# Patient Record
Sex: Female | Born: 1980 | Race: Black or African American | Hispanic: No | Marital: Married | Smoking: Never smoker
Health system: Southern US, Community
[De-identification: ages and names within clinical notes are randomized; demographics above are authoritative.]

## PROBLEM LIST (undated history)

## (undated) DIAGNOSIS — R519 Headache, unspecified: Secondary | ICD-10-CM

## (undated) DIAGNOSIS — J45909 Unspecified asthma, uncomplicated: Secondary | ICD-10-CM

## (undated) DIAGNOSIS — I10 Essential (primary) hypertension: Secondary | ICD-10-CM

## (undated) DIAGNOSIS — T7840XA Allergy, unspecified, initial encounter: Secondary | ICD-10-CM

## (undated) HISTORY — PX: LAPAROSCOPIC GASTRIC BANDING: SHX1100

## (undated) HISTORY — DX: Headache, unspecified: R51.9

## (undated) HISTORY — PX: GASTRECTOMY: SHX58

## (undated) HISTORY — PX: GASTRIC BYPASS: SHX52

## (undated) HISTORY — DX: Unspecified asthma, uncomplicated: J45.909

## (undated) HISTORY — DX: Essential (primary) hypertension: I10

## (undated) HISTORY — DX: Allergy, unspecified, initial encounter: T78.40XA

---

## 2020-03-15 DIAGNOSIS — J4530 Mild persistent asthma, uncomplicated: Secondary | ICD-10-CM | POA: Insufficient documentation

## 2020-03-15 DIAGNOSIS — Z9884 Bariatric surgery status: Secondary | ICD-10-CM | POA: Insufficient documentation

## 2020-03-15 DIAGNOSIS — F419 Anxiety disorder, unspecified: Secondary | ICD-10-CM | POA: Insufficient documentation

## 2020-03-15 DIAGNOSIS — K219 Gastro-esophageal reflux disease without esophagitis: Secondary | ICD-10-CM | POA: Insufficient documentation

## 2020-05-19 ENCOUNTER — Emergency Department
Admission: EM | Admit: 2020-05-19 | Discharge: 2020-05-19 | Disposition: A | Discharge status: Home / Self Care | Payer: BC Managed Care – PPO | Attending: Emergency Medicine | Admitting: Emergency Medicine

## 2020-05-19 ENCOUNTER — Emergency Department: Payer: BC Managed Care – PPO

## 2020-05-19 ENCOUNTER — Encounter: Payer: Self-pay | Admitting: Radiology

## 2020-05-19 ENCOUNTER — Other Ambulatory Visit: Payer: Self-pay

## 2020-05-19 DIAGNOSIS — R1012 Left upper quadrant pain: Secondary | ICD-10-CM | POA: Insufficient documentation

## 2020-05-19 DIAGNOSIS — R030 Elevated blood-pressure reading, without diagnosis of hypertension: Secondary | ICD-10-CM | POA: Diagnosis not present

## 2020-05-19 DIAGNOSIS — R109 Unspecified abdominal pain: Secondary | ICD-10-CM

## 2020-05-19 DIAGNOSIS — R1110 Vomiting, unspecified: Secondary | ICD-10-CM | POA: Insufficient documentation

## 2020-05-19 LAB — CBC
HCT: 34.9 % — ABNORMAL LOW (ref 36.0–46.0)
Hemoglobin: 12 g/dL (ref 12.0–15.0)
MCH: 31.5 pg (ref 26.0–34.0)
MCHC: 34.4 g/dL (ref 30.0–36.0)
MCV: 91.6 fL (ref 80.0–100.0)
Platelets: 244 10*3/uL (ref 150–400)
RBC: 3.81 MIL/uL — ABNORMAL LOW (ref 3.87–5.11)
RDW: 12.9 % (ref 11.5–15.5)
WBC: 3.9 10*3/uL — ABNORMAL LOW (ref 4.0–10.5)
nRBC: 0 % (ref 0.0–0.2)

## 2020-05-19 LAB — URINALYSIS, COMPLETE (UACMP) WITH MICROSCOPIC
Bacteria, UA: NONE SEEN
Bilirubin Urine: NEGATIVE
Glucose, UA: NEGATIVE mg/dL
Ketones, ur: 5 mg/dL — AB
Leukocytes,Ua: NEGATIVE
Nitrite: NEGATIVE
Protein, ur: NEGATIVE mg/dL
Specific Gravity, Urine: 1.014 (ref 1.005–1.030)
pH: 6 (ref 5.0–8.0)

## 2020-05-19 LAB — COMPREHENSIVE METABOLIC PANEL
ALT: 35 U/L (ref 0–44)
AST: 74 U/L — ABNORMAL HIGH (ref 15–41)
Albumin: 4.2 g/dL (ref 3.5–5.0)
Alkaline Phosphatase: 65 U/L (ref 38–126)
Anion gap: 10 (ref 5–15)
BUN: 13 mg/dL (ref 6–20)
CO2: 25 mmol/L (ref 22–32)
Calcium: 8.8 mg/dL — ABNORMAL LOW (ref 8.9–10.3)
Chloride: 101 mmol/L (ref 98–111)
Creatinine, Ser: 0.63 mg/dL (ref 0.44–1.00)
GFR, Estimated: >60 mL/min (ref >60)
Glucose, Bld: 85 mg/dL (ref 70–99)
Potassium: 3.9 mmol/L (ref 3.5–5.1)
Sodium: 136 mmol/L (ref 135–145)
Total Bilirubin: 0.6 mg/dL (ref 0.3–1.2)
Total Protein: 7.9 g/dL (ref 6.5–8.1)

## 2020-05-19 LAB — POC URINE PREG, ED: Preg Test, Ur: NEGATIVE

## 2020-05-19 LAB — LIPASE, BLOOD: Lipase: 30 U/L (ref 11–51)

## 2020-05-19 MED ORDER — IOHEXOL 300 MG/ML  SOLN
100.0000 mL | Freq: Once | INTRAMUSCULAR | Status: AC | PRN
  Administered 2020-05-19: 100 mL via INTRAVENOUS
  Filled 2020-05-19: qty 100

## 2020-05-19 MED ORDER — IOHEXOL 9 MG/ML PO SOLN
500.0000 mL | ORAL | Status: AC
  Administered 2020-05-19 (×2): 500 mL via ORAL
  Filled 2020-05-19 (×2): qty 500

## 2020-05-19 MED ORDER — ALUM & MAG HYDROXIDE-SIMETH 200-200-20 MG/5ML PO SUSP
30.0000 mL | Freq: Once | ORAL | Status: AC
  Administered 2020-05-19: 30 mL via ORAL
  Filled 2020-05-19: qty 30

## 2020-05-19 NOTE — ED Notes (Signed)
Pt states intense stomach pain on L side started this AM at work while having a BM. Pt states the pain was so bad she vomited after her BM.   Pt denies any urinary symptoms or bowel movement abnormalities at this time.   Pt states she has hx of ulcer, and gastric bypass surgery.

## 2020-05-19 NOTE — ED Notes (Signed)
PT verbalized understanding of d/c instructions at this time. Pt denies further questions at this time. Pt ambulatory to lobby, NAD noted, steady gait noted.

## 2020-05-19 NOTE — ED Triage Notes (Signed)
PT to ED for chief complaint of mid left abdominal pain while having a BM this morning, and vomiting x1.  Hx weight loss surgery and ulcers

## 2020-05-19 NOTE — ED Provider Notes (Signed)
[facility] Emergency Department Provider Note  ____________________________________________   Event Date/Time   First MD Initiated Contact with Patient 05/19/20 1334     (approximate)  I have reviewed the triage vital signs and the nursing notes.   HISTORY  Chief Complaint Abdominal Pain   HPI Kara Tate is a 40 y.o. female with a past medical history of asthma, GERD, and gastric bypass surgery performed in 2020 in [place] who presents to emergency room for an episode of left-sided abdominal discomfort that occurred this morning after she had a bowel movement and was associated with some dry heaving and small bit of nonbloody emesis.  No prior similar episodes.  No clear alleviating or aggravating or precipitating events.  Patient denies any earache, sore throat, chest pain, cough, shortness of breath, blood in her stool, urinary symptoms, back pain, EXTR rash or extremity pain.  She takes omeprazole as recommended by her PCP and does not use any significant NSAIDs.  She does drink 2 to 3 glasses of wine per night.  No tobacco or drugs are not prescribed to her.          History reviewed. No pertinent past medical history.  There are no problems to display for this patient.     Prior to Admission medications   Not on File    Allergies Patient has no allergy information on record.  No family history on file.  Social History    Review of Systems  Review of Systems  Constitutional: Negative for chills and fever.  HENT: Negative for sore throat.   Eyes: Negative for pain.  Respiratory: Negative for cough and stridor.   Cardiovascular: Negative for chest pain.  Gastrointestinal: Positive for abdominal pain, nausea and vomiting.  Genitourinary: Negative for dysuria.  Musculoskeletal: Negative for myalgias.  Skin: Negative for rash.  Neurological: Negative for seizures, loss of consciousness and headaches.   Psychiatric/Behavioral: Negative for suicidal ideas.  All other systems reviewed and are negative.     ____________________________________________   PHYSICAL EXAM:  VITAL SIGNS: ED Triage Vitals  Enc Vitals Group     BP 05/19/20 1239 (!) 153/104     Pulse Rate 05/19/20 1237 67     Resp 05/19/20 1237 18     Temp 05/19/20 1237 98.3 F (36.8 C)     Temp Source 05/19/20 1237 Oral     SpO2 05/19/20 1237 100 %     Weight 05/19/20 1237 170 lb (77.1 kg)     Height 05/19/20 1237 5\' 2"  (1.575 m)     Head Circumference --      Peak Flow --      Pain Score 05/19/20 1237 10     Pain Loc --      Pain Edu? --      Excl. in GC? --    Vitals:   05/19/20 1237 05/19/20 1239  BP:  (!) 153/104  Pulse: 67   Resp: 18   Temp: 98.3 F (36.8 C)   SpO2: 100%    Physical Exam Vitals and nursing note reviewed.  Constitutional:      General: She is not in acute distress.    Appearance: She is well-developed and well-nourished.  HENT:     Head: Normocephalic and atraumatic.     Right Ear: External ear normal.     Left Ear: External ear normal.     Nose: Nose normal.  Eyes:     Conjunctiva/sclera: Conjunctivae normal.  Cardiovascular:  Rate and Rhythm: Normal rate and regular rhythm.     Heart sounds: No murmur heard.   Pulmonary:     Effort: Pulmonary effort is normal. No respiratory distress.     Breath sounds: Normal breath sounds.  Abdominal:     Palpations: Abdomen is soft.     Tenderness: There is abdominal tenderness in the left upper quadrant. There is no right CVA tenderness or left CVA tenderness.  Musculoskeletal:        General: No edema.     Cervical back: Neck supple.  Skin:    General: Skin is warm and dry.     Capillary Refill: Capillary refill takes less than 2 seconds.  Neurological:     Mental Status: She is alert and oriented to person, place, and time.  Psychiatric:        Mood and Affect: Mood and affect and mood normal.       ____________________________________________   LABS (all labs ordered are listed, but only abnormal results are displayed)  Labs Reviewed  COMPREHENSIVE METABOLIC PANEL - Abnormal; Notable for the following components:      Result Value   Calcium 8.8 (*)    AST 74 (*)    All other components within normal limits  CBC - Abnormal; Notable for the following components:   WBC 3.9 (*)    RBC 3.81 (*)    HCT 34.9 (*)    All other components within normal limits  URINALYSIS, COMPLETE (UACMP) WITH MICROSCOPIC - Abnormal; Notable for the following components:   Color, Urine YELLOW (*)    APPearance CLEAR (*)    Hgb urine dipstick MODERATE (*)    Ketones, ur 5 (*)    All other components within normal limits  LIPASE, BLOOD  POC URINE PREG, ED   ____________________________________________  EKG  Sinus bradycardia with ventricular rate of 57, normal axis, unremarkable levels, evidence of acute ischemia or significant underlying arrhythmia. ____________________________________________  RADIOLOGY  ED MD interpretation: CT without evidence of diverticulitis, SBO, pyelonephritis, splenic infarct, perforated viscus, appendicitis, or any other acute intra-abdominal or pelvic pathology that would explain patient's symptoms.  Official radiology report(s): CT ABDOMEN PELVIS W CONTRAST  Result Date: 05/19/2020 CLINICAL DATA:  Abdominal pain.  Left-sided.  Vomiting. EXAM: CT ABDOMEN AND PELVIS WITH CONTRAST TECHNIQUE: Multidetector CT imaging of the abdomen and pelvis was performed using the standard protocol following bolus administration of intravenous contrast. CONTRAST:  OMNIPAQUE IOHEXOL 300 MG/ML  SOLN COMPARISON:  None. FINDINGS: Lower chest: Unremarkable. Hepatobiliary: No suspicious focal abnormality within the liver parenchyma. There is no evidence for gallstones, gallbladder wall thickening, or pericholecystic fluid. No intrahepatic or extrahepatic biliary dilation. Pancreas: No  focal mass lesion. No dilatation of the main duct. No intraparenchymal cyst. No peripancreatic edema. Spleen: No splenomegaly. No focal mass lesion. Adrenals/Urinary Tract: No adrenal nodule or mass. Kidneys unremarkable. No evidence for hydroureter. The urinary bladder appears normal for the degree of distention. Stomach/Bowel: Status post gastric bypass. No small bowel wall thickening. No small bowel dilatation. The terminal ileum is normal. The appendix is normal. No gross colonic mass. No colonic wall thickening. Vascular/Lymphatic: No abdominal aortic aneurysm. No abdominal aortic atherosclerotic calcification. There is no gastrohepatic or hepatoduodenal ligament lymphadenopathy. No retroperitoneal or mesenteric lymphadenopathy. No pelvic sidewall lymphadenopathy. Reproductive: IUD visualized in the uterus. There is no adnexal mass. Other: No intraperitoneal free fluid. Musculoskeletal: No worrisome lytic or sclerotic osseous abnormality. IMPRESSION: 1. No acute findings in the abdomen or pelvis. Specifically,  no findings to explain the patient's history of left-sided abdominal pain with vomiting. 2. Status post gastric bypass. No evidence for small bowel obstruction. Electronically Signed   By: Kennith Center M.D.   On: 05/19/2020 15:35    ____________________________________________   PROCEDURES  Procedure(s) performed (including Critical Care):  Procedures   ____________________________________________   INITIAL IMPRESSION / ASSESSMENT AND PLAN / ED COURSE        Patient presents with above history exam for assessment of subacute onset of left upper quadrant abdominal pain that occurred after a bowel movement this morning and was associate with some nausea and small amount of nonbloody emesis.  This in the setting of gastric bypass surgery done in [place] in 2020.  Patient is hypertensive otherwise stable vital signs on arrival.    Differential includes but is not limited to gastritis  versus PUD versus complication from bypass surgery, diverticulitis, SBO, pancreatitis, cholecystitis, pyelonephritis, kidney stone and possible atypical presentation of ACS.  Low suspicion for ACS given patient denies any chest pain and has a reassuring EKG.  CT has no evidence of diverticulitis, SBO, pancreatitis, cholecystitis, pyelonephritis or kidney stone.  Lipase of 30 is also not consistent with pancreatitis.  CMP unremarkable for any significant electrolyte or metabolic derangements although AST is just above normal limits.  CBC is very slight leukopenia at 3.9 otherwise unremarkable.  Urine pregnancy test is negative and urine has little bit of blood and ketones but otherwise unremarkable for evidence of infection.   Given stable vital signs otherwise reassuring exam and work-up I believe patient is safe for discharge with plan for continued outpatient evaluation and management.  Patient did request outpatient referral to GI and she was given GI clinic information.  She is already on acid suppression medications we will defer any additional medications at this time.  Advised patient is to decrease her alcohol intake to less than 2 alcoholic beverages per night.  Patient discharged stable condition.  Strict return precautions advised and discussed.    ____________________________________________   FINAL CLINICAL IMPRESSION(S) / ED DIAGNOSES  Final diagnoses:  Abdominal pain, unspecified abdominal location    Medications  iohexol (OMNIPAQUE) 9 MG/ML oral solution 500 mL (500 mLs Oral Contrast Given 05/19/20 1350)  iohexol (OMNIPAQUE) 300 MG/ML solution 100 mL (100 mLs Intravenous Contrast Given 05/19/20 1522)  alum & mag hydroxide-simeth (MAALOX/MYLANTA) 200-200-20 MG/5ML suspension 30 mL (30 mLs Oral Given 05/19/20 1504)     ED Discharge Orders    None       Note:  This document was prepared using Dragon voice recognition software and may include unintentional dictation errors.    Gilles Chiquito, MD 05/19/20 1550

## 2020-09-14 ENCOUNTER — Ambulatory Visit: Payer: BC Managed Care – PPO | Admitting: Nurse Practitioner

## 2020-09-14 ENCOUNTER — Other Ambulatory Visit: Payer: Self-pay

## 2020-09-14 VITALS — BP 130/94 | HR 66 | Temp 99.2°F | Resp 17

## 2020-09-14 DIAGNOSIS — S40861A Insect bite (nonvenomous) of right upper arm, initial encounter: Secondary | ICD-10-CM

## 2020-09-14 DIAGNOSIS — W57XXXA Bitten or stung by nonvenomous insect and other nonvenomous arthropods, initial encounter: Secondary | ICD-10-CM

## 2020-09-14 NOTE — Progress Notes (Signed)
   Subjective:    Patient ID: Kara Tate, female    DOB: [birth year], 40 y.o.   MRN: [id number]  HPI  40 year old female presenting to ESW immediately after being stung by a bee in the parking lot outside of the building. She was stung once in the right forearm. Has been stung in the past without allergic response.   Today's Vitals   09/14/20 1359  BP: (!) 130/94  Pulse: 66  Resp: 17  Temp: 99.2 F (37.3 C)  TempSrc: Tympanic  SpO2: 99%   There is no height or weight on file to calculate BMI.  Review of Systems  Constitutional: Negative.   HENT: Negative.   Respiratory: Negative.   Skin: Positive for wound.       Objective:   Physical Exam HENT:     Head: Normocephalic.  Cardiovascular:     Rate and Rhythm: Normal rate and regular rhythm.     Heart sounds: Normal heart sounds.  Pulmonary:     Effort: Pulmonary effort is normal.     Breath sounds: Normal breath sounds.  Skin:    Findings: Signs of injury present.          Comments: Bite mark to right forearm as highlighted in image. Surrounding skin WNL. No retained foreign object visualized. Soft and tender to touch. No erythema skin temperature uniform.   Neurological:     Mental Status: She is alert.           Assessment & Plan:  Administered tylenol 500mg  and Claritin 10mg  in clinic.  Patient instructed to take benadryl if any onset of rash/swelling With any more concerning symptoms RTC or seek medical attention if after hours

## 2021-03-01 ENCOUNTER — Ambulatory Visit: Payer: BC Managed Care – PPO

## 2021-03-01 ENCOUNTER — Other Ambulatory Visit: Payer: Self-pay

## 2021-03-01 DIAGNOSIS — Z23 Encounter for immunization: Secondary | ICD-10-CM

## 2021-09-29 DIAGNOSIS — K580 Irritable bowel syndrome with diarrhea: Secondary | ICD-10-CM | POA: Insufficient documentation

## 2021-10-18 DIAGNOSIS — F411 Generalized anxiety disorder: Secondary | ICD-10-CM | POA: Diagnosis not present

## 2021-10-19 DIAGNOSIS — E669 Obesity, unspecified: Secondary | ICD-10-CM | POA: Diagnosis not present

## 2021-10-19 DIAGNOSIS — F419 Anxiety disorder, unspecified: Secondary | ICD-10-CM | POA: Diagnosis not present

## 2021-10-19 DIAGNOSIS — Z9884 Bariatric surgery status: Secondary | ICD-10-CM | POA: Diagnosis not present

## 2021-10-19 DIAGNOSIS — Z713 Dietary counseling and surveillance: Secondary | ICD-10-CM | POA: Diagnosis not present

## 2021-11-03 DIAGNOSIS — F411 Generalized anxiety disorder: Secondary | ICD-10-CM | POA: Diagnosis not present

## 2021-11-16 DIAGNOSIS — M25512 Pain in left shoulder: Secondary | ICD-10-CM | POA: Diagnosis not present

## 2021-11-17 DIAGNOSIS — F411 Generalized anxiety disorder: Secondary | ICD-10-CM | POA: Diagnosis not present

## 2021-12-01 DIAGNOSIS — M25512 Pain in left shoulder: Secondary | ICD-10-CM | POA: Diagnosis not present

## 2021-12-14 DIAGNOSIS — M25512 Pain in left shoulder: Secondary | ICD-10-CM | POA: Diagnosis not present

## 2021-12-20 DIAGNOSIS — M25512 Pain in left shoulder: Secondary | ICD-10-CM | POA: Diagnosis not present

## 2021-12-21 DIAGNOSIS — F411 Generalized anxiety disorder: Secondary | ICD-10-CM | POA: Diagnosis not present

## 2022-01-03 DIAGNOSIS — R29898 Other symptoms and signs involving the musculoskeletal system: Secondary | ICD-10-CM | POA: Diagnosis not present

## 2022-01-17 DIAGNOSIS — Z8711 Personal history of peptic ulcer disease: Secondary | ICD-10-CM | POA: Diagnosis not present

## 2022-01-17 DIAGNOSIS — F419 Anxiety disorder, unspecified: Secondary | ICD-10-CM | POA: Diagnosis not present

## 2022-01-17 DIAGNOSIS — Z131 Encounter for screening for diabetes mellitus: Secondary | ICD-10-CM | POA: Diagnosis not present

## 2022-01-17 DIAGNOSIS — Z1331 Encounter for screening for depression: Secondary | ICD-10-CM | POA: Diagnosis not present

## 2022-01-17 DIAGNOSIS — Z9884 Bariatric surgery status: Secondary | ICD-10-CM | POA: Diagnosis not present

## 2022-01-17 DIAGNOSIS — Z0000 Encounter for general adult medical examination without abnormal findings: Secondary | ICD-10-CM | POA: Diagnosis not present

## 2022-01-17 DIAGNOSIS — F411 Generalized anxiety disorder: Secondary | ICD-10-CM | POA: Diagnosis not present

## 2022-01-17 DIAGNOSIS — K219 Gastro-esophageal reflux disease without esophagitis: Secondary | ICD-10-CM | POA: Diagnosis not present

## 2022-01-17 DIAGNOSIS — Z13220 Encounter for screening for lipoid disorders: Secondary | ICD-10-CM | POA: Diagnosis not present

## 2022-01-18 DIAGNOSIS — F411 Generalized anxiety disorder: Secondary | ICD-10-CM | POA: Diagnosis not present

## 2022-02-07 DIAGNOSIS — F411 Generalized anxiety disorder: Secondary | ICD-10-CM | POA: Diagnosis not present

## 2022-02-12 IMAGING — CT CT ABD-PELV W/ CM
2 of 4 series · 17 of 46 positions shown, 19 images · IV contrast (omnipaque)
Comparison: None.

CLINICAL DATA: Abdominal pain.  Left-sided.  Vomiting.

EXAM:
CT ABDOMEN AND PELVIS WITH CONTRAST
TECHNIQUE: Multidetector CT imaging of the abdomen and pelvis was performed
using the standard protocol following bolus administration of
intravenous contrast.
CONTRAST:  100mL OMNIPAQUE IOHEXOL 300 MG/ML  SOLN

[Series 2: routine abd/pel with · axial · 0.82mm/px · z∈[-861,-491]mm · 14 of 82 slices shown, 16 images]
[im 4/82  soft-tissue]
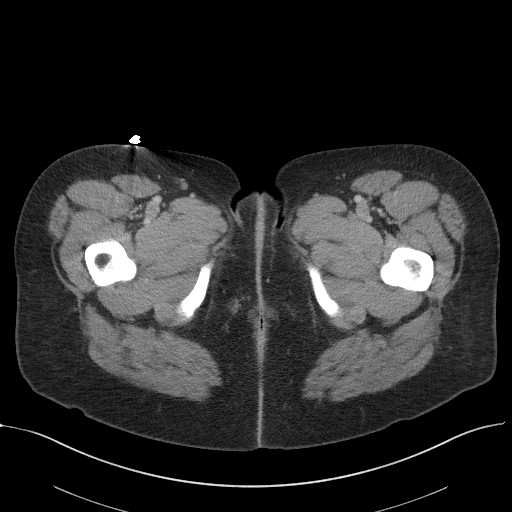
[im 4/82  bone]
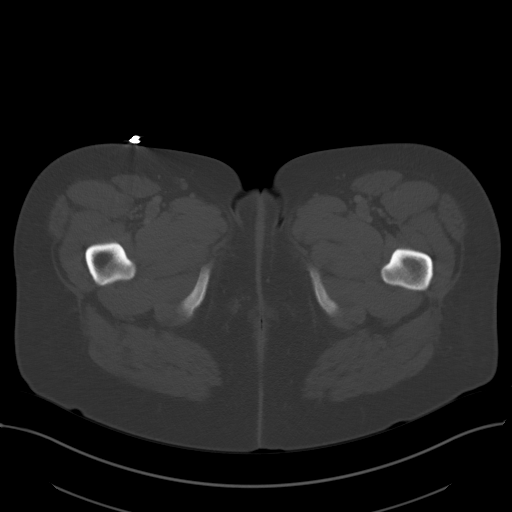
[im 11/82  soft-tissue]
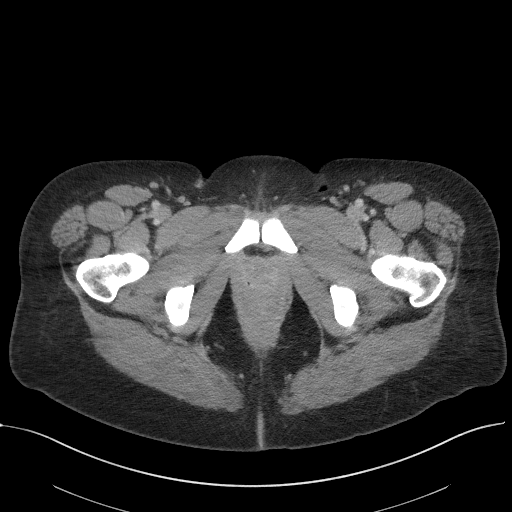
[im 17/82  soft-tissue]
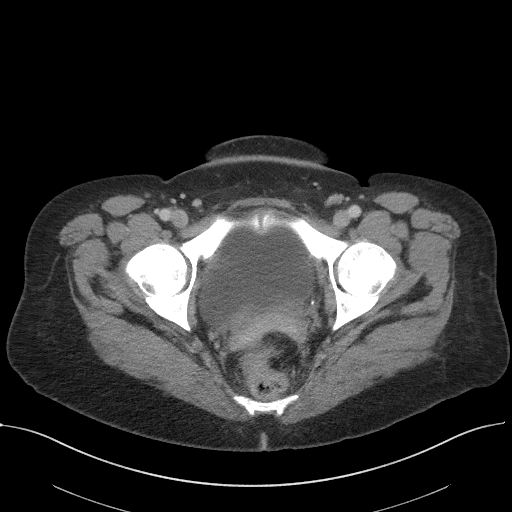
[im 21/82  soft-tissue]
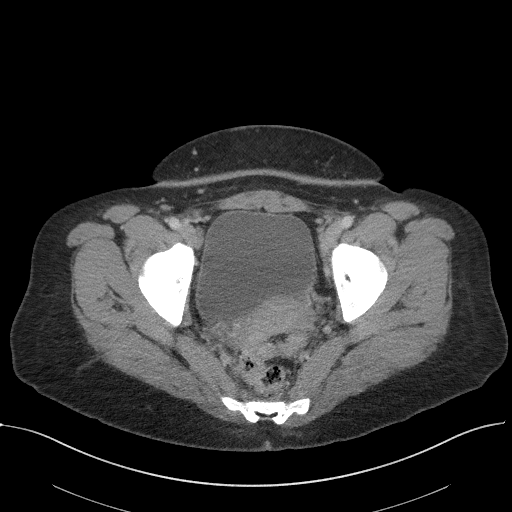
[im 28/82  soft-tissue]
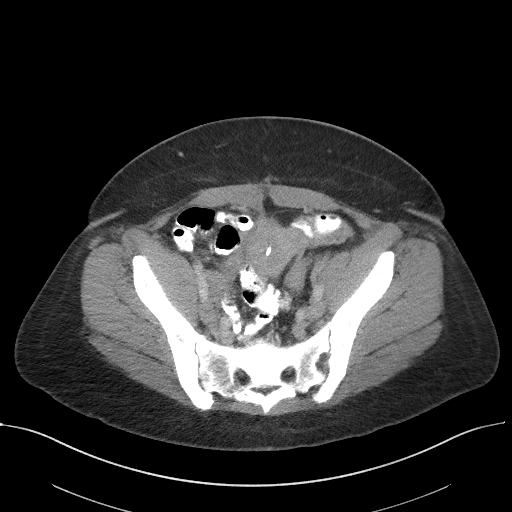
[im 34/82  soft-tissue]
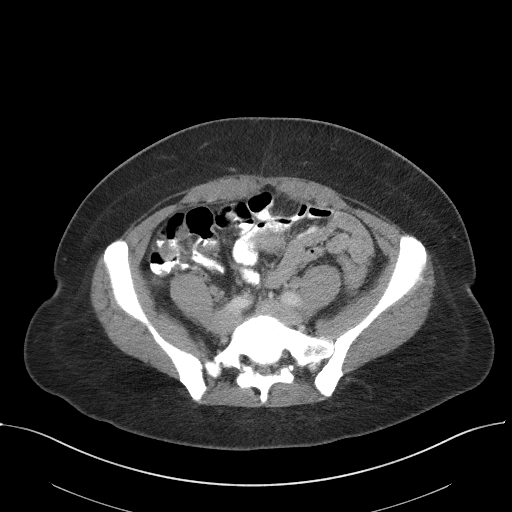
[im 38/82  soft-tissue]
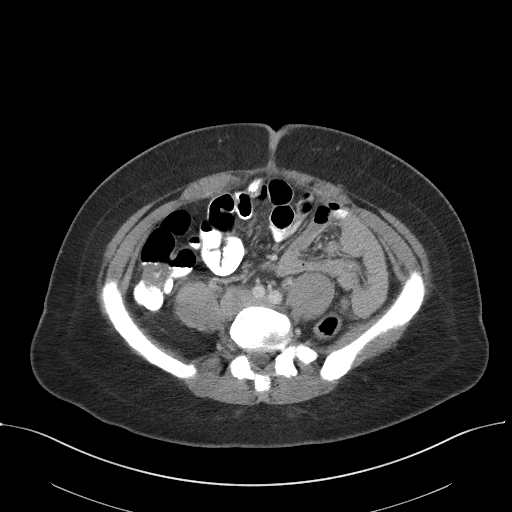
[im 44/82  soft-tissue]
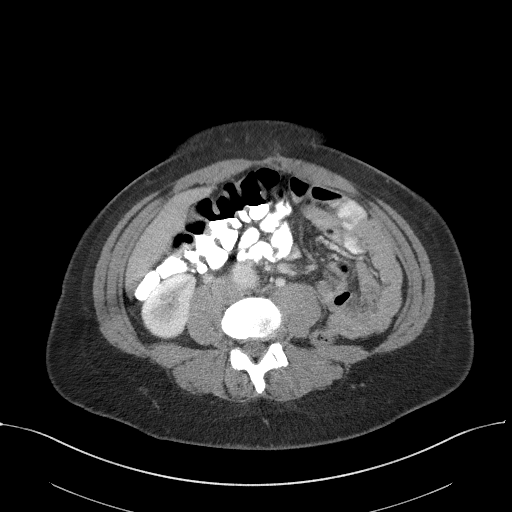
[im 48/82  soft-tissue]
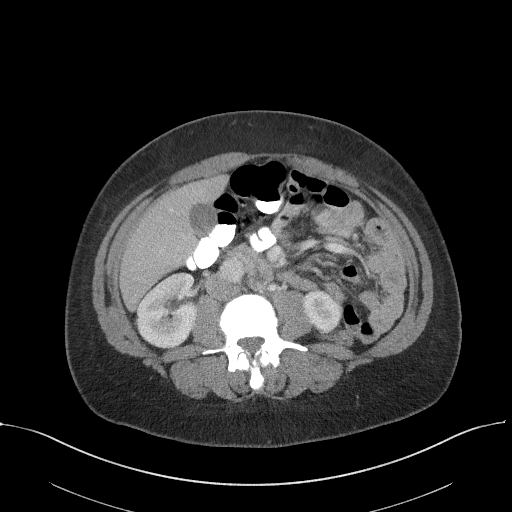
[im 48/82  bone]
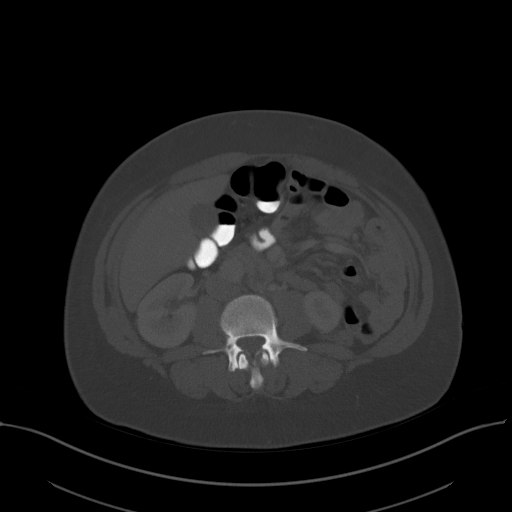
[im 55/82  soft-tissue]
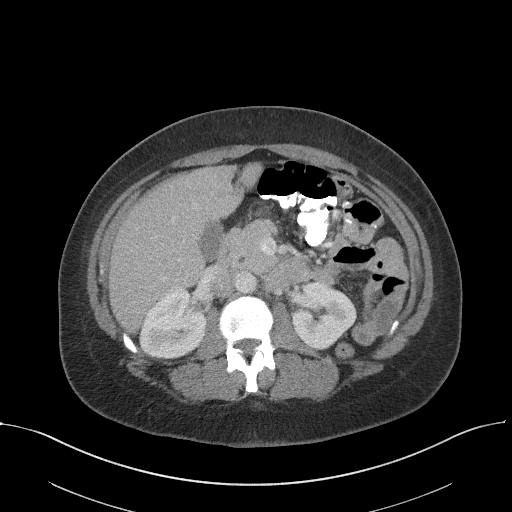
[im 61/82  soft-tissue]
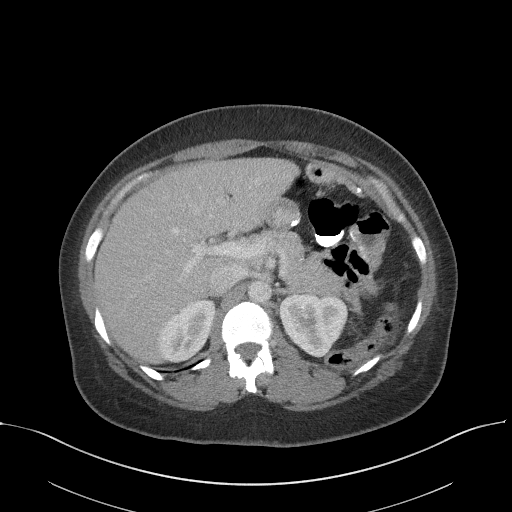
[im 65/82  soft-tissue]
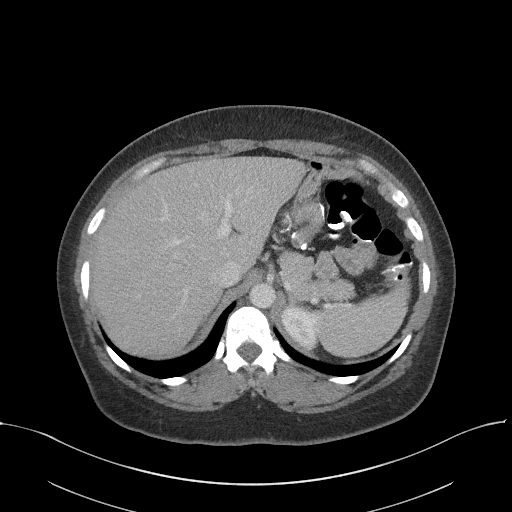
[im 71/82  soft-tissue]
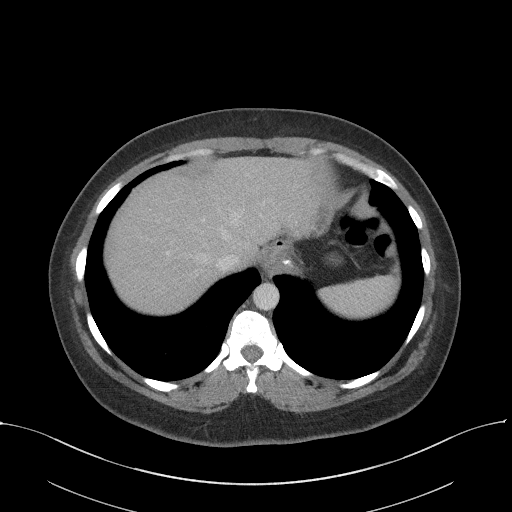
[im 78/82  soft-tissue]
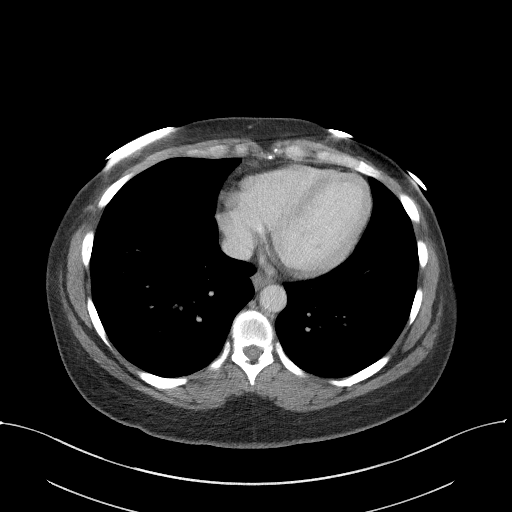

[Series 5: coronal st · coronal · 0.80mm/px · 3 of 88 slices shown]
[im 30/88  soft-tissue]
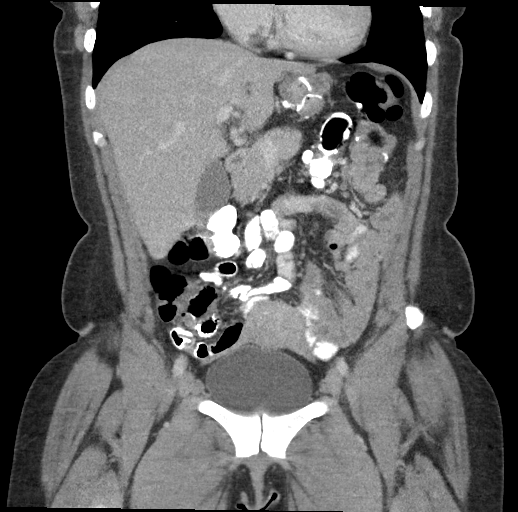
[im 39/88  soft-tissue]
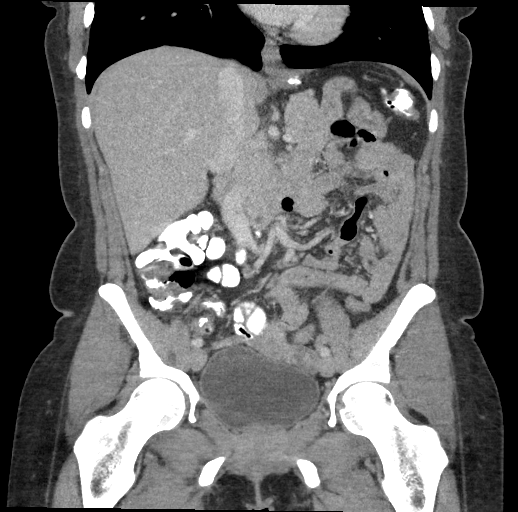
[im 49/88  soft-tissue]
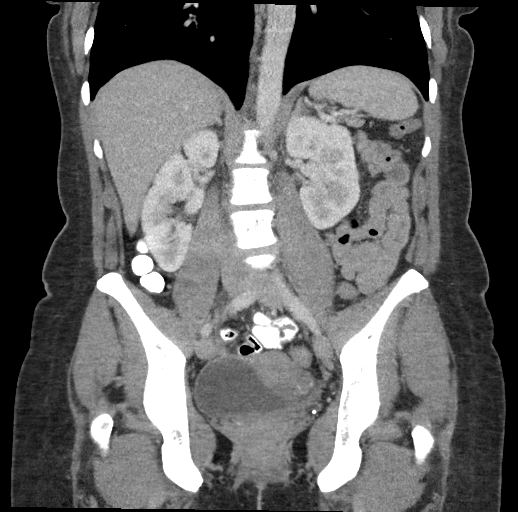

[17 of 46 positions shown; findings below may reference images not displayed]

FINDINGS: Lower chest: Unremarkable.

Hepatobiliary: No suspicious focal abnormality within the liver
parenchyma. There is no evidence for gallstones, gallbladder wall
thickening, or pericholecystic fluid. No intrahepatic or
extrahepatic biliary dilation.

Pancreas: No focal mass lesion. No dilatation of the main duct. No
intraparenchymal cyst. No peripancreatic edema.

Spleen: No splenomegaly. No focal mass lesion.

Adrenals/Urinary Tract: No adrenal nodule or mass. Kidneys
unremarkable. No evidence for hydroureter. The urinary bladder
appears normal for the degree of distention.

Stomach/Bowel: Status post gastric bypass. No small bowel wall
thickening. No small bowel dilatation. The terminal ileum is normal.
The appendix is normal. No gross colonic mass. No colonic wall
thickening.

Vascular/Lymphatic: No abdominal aortic aneurysm. No abdominal
aortic atherosclerotic calcification. There is no gastrohepatic or
hepatoduodenal ligament lymphadenopathy. No retroperitoneal or
mesenteric lymphadenopathy. No pelvic sidewall lymphadenopathy.

Reproductive: IUD visualized in the uterus. There is no adnexal
mass.

Other: No intraperitoneal free fluid.

Musculoskeletal: No worrisome lytic or sclerotic osseous
abnormality.
IMPRESSION: 1. No acute findings in the abdomen or pelvis. Specifically, no
findings to explain the patient's history of left-sided abdominal
pain with vomiting.
2. Status post gastric bypass. No evidence for small bowel
obstruction.

## 2022-03-01 DIAGNOSIS — F411 Generalized anxiety disorder: Secondary | ICD-10-CM | POA: Diagnosis not present

## 2022-03-21 DIAGNOSIS — F411 Generalized anxiety disorder: Secondary | ICD-10-CM | POA: Diagnosis not present

## 2022-04-03 DIAGNOSIS — Z01419 Encounter for gynecological examination (general) (routine) without abnormal findings: Secondary | ICD-10-CM | POA: Diagnosis not present

## 2022-04-04 DIAGNOSIS — F411 Generalized anxiety disorder: Secondary | ICD-10-CM | POA: Diagnosis not present

## 2022-04-12 DIAGNOSIS — F411 Generalized anxiety disorder: Secondary | ICD-10-CM | POA: Diagnosis not present

## 2022-05-24 DIAGNOSIS — F411 Generalized anxiety disorder: Secondary | ICD-10-CM | POA: Diagnosis not present

## 2022-06-12 DIAGNOSIS — F411 Generalized anxiety disorder: Secondary | ICD-10-CM | POA: Diagnosis not present

## 2022-07-26 DIAGNOSIS — R030 Elevated blood-pressure reading, without diagnosis of hypertension: Secondary | ICD-10-CM | POA: Diagnosis not present

## 2022-07-26 DIAGNOSIS — R300 Dysuria: Secondary | ICD-10-CM | POA: Diagnosis not present

## 2022-07-26 DIAGNOSIS — R519 Headache, unspecified: Secondary | ICD-10-CM | POA: Diagnosis not present

## 2022-07-26 DIAGNOSIS — F411 Generalized anxiety disorder: Secondary | ICD-10-CM | POA: Diagnosis not present

## 2022-07-26 DIAGNOSIS — R8290 Unspecified abnormal findings in urine: Secondary | ICD-10-CM | POA: Diagnosis not present

## 2022-08-06 DIAGNOSIS — F411 Generalized anxiety disorder: Secondary | ICD-10-CM | POA: Diagnosis not present

## 2022-08-16 DIAGNOSIS — E063 Autoimmune thyroiditis: Secondary | ICD-10-CM | POA: Diagnosis not present

## 2022-08-16 DIAGNOSIS — Z130 Encounter for screening for diseases of the blood and blood-forming organs and certain disorders involving the immune mechanism: Secondary | ICD-10-CM | POA: Diagnosis not present

## 2022-08-16 DIAGNOSIS — Z13220 Encounter for screening for lipoid disorders: Secondary | ICD-10-CM | POA: Diagnosis not present

## 2022-08-16 DIAGNOSIS — E669 Obesity, unspecified: Secondary | ICD-10-CM | POA: Diagnosis not present

## 2022-08-16 DIAGNOSIS — K219 Gastro-esophageal reflux disease without esophagitis: Secondary | ICD-10-CM | POA: Diagnosis not present

## 2022-08-16 DIAGNOSIS — Z9884 Bariatric surgery status: Secondary | ICD-10-CM | POA: Diagnosis not present

## 2022-08-16 DIAGNOSIS — Z131 Encounter for screening for diabetes mellitus: Secondary | ICD-10-CM | POA: Diagnosis not present

## 2022-08-16 DIAGNOSIS — F411 Generalized anxiety disorder: Secondary | ICD-10-CM | POA: Diagnosis not present

## 2022-08-16 DIAGNOSIS — N951 Menopausal and female climacteric states: Secondary | ICD-10-CM | POA: Diagnosis not present

## 2022-08-23 DIAGNOSIS — E063 Autoimmune thyroiditis: Secondary | ICD-10-CM | POA: Diagnosis not present

## 2022-08-23 DIAGNOSIS — N951 Menopausal and female climacteric states: Secondary | ICD-10-CM | POA: Diagnosis not present

## 2022-08-23 DIAGNOSIS — R635 Abnormal weight gain: Secondary | ICD-10-CM | POA: Diagnosis not present

## 2022-08-23 DIAGNOSIS — K219 Gastro-esophageal reflux disease without esophagitis: Secondary | ICD-10-CM | POA: Diagnosis not present

## 2022-08-23 DIAGNOSIS — Z1339 Encounter for screening examination for other mental health and behavioral disorders: Secondary | ICD-10-CM | POA: Diagnosis not present

## 2022-08-23 DIAGNOSIS — Z1331 Encounter for screening for depression: Secondary | ICD-10-CM | POA: Diagnosis not present

## 2022-08-27 DIAGNOSIS — F411 Generalized anxiety disorder: Secondary | ICD-10-CM | POA: Diagnosis not present

## 2022-08-30 DIAGNOSIS — K219 Gastro-esophageal reflux disease without esophagitis: Secondary | ICD-10-CM | POA: Diagnosis not present

## 2022-08-30 DIAGNOSIS — Z6833 Body mass index (BMI) 33.0-33.9, adult: Secondary | ICD-10-CM | POA: Diagnosis not present

## 2022-09-10 DIAGNOSIS — R29898 Other symptoms and signs involving the musculoskeletal system: Secondary | ICD-10-CM | POA: Diagnosis not present

## 2022-09-12 DIAGNOSIS — N3000 Acute cystitis without hematuria: Secondary | ICD-10-CM | POA: Diagnosis not present

## 2022-09-12 DIAGNOSIS — N760 Acute vaginitis: Secondary | ICD-10-CM | POA: Diagnosis not present

## 2022-09-12 DIAGNOSIS — R399 Unspecified symptoms and signs involving the genitourinary system: Secondary | ICD-10-CM | POA: Diagnosis not present

## 2022-09-19 DIAGNOSIS — F411 Generalized anxiety disorder: Secondary | ICD-10-CM | POA: Diagnosis not present

## 2022-09-20 DIAGNOSIS — K219 Gastro-esophageal reflux disease without esophagitis: Secondary | ICD-10-CM | POA: Diagnosis not present

## 2022-09-20 DIAGNOSIS — Z6832 Body mass index (BMI) 32.0-32.9, adult: Secondary | ICD-10-CM | POA: Diagnosis not present

## 2022-09-27 DIAGNOSIS — Z6833 Body mass index (BMI) 33.0-33.9, adult: Secondary | ICD-10-CM | POA: Diagnosis not present

## 2022-09-27 DIAGNOSIS — E063 Autoimmune thyroiditis: Secondary | ICD-10-CM | POA: Diagnosis not present

## 2022-10-03 DIAGNOSIS — M25512 Pain in left shoulder: Secondary | ICD-10-CM | POA: Diagnosis not present

## 2022-10-17 DIAGNOSIS — E063 Autoimmune thyroiditis: Secondary | ICD-10-CM | POA: Diagnosis not present

## 2022-10-17 DIAGNOSIS — R29898 Other symptoms and signs involving the musculoskeletal system: Secondary | ICD-10-CM | POA: Diagnosis not present

## 2022-10-17 DIAGNOSIS — F411 Generalized anxiety disorder: Secondary | ICD-10-CM | POA: Diagnosis not present

## 2022-10-17 DIAGNOSIS — K589 Irritable bowel syndrome without diarrhea: Secondary | ICD-10-CM | POA: Diagnosis not present

## 2022-10-17 DIAGNOSIS — Z6833 Body mass index (BMI) 33.0-33.9, adult: Secondary | ICD-10-CM | POA: Diagnosis not present

## 2022-10-25 DIAGNOSIS — F411 Generalized anxiety disorder: Secondary | ICD-10-CM | POA: Diagnosis not present

## 2022-10-30 ENCOUNTER — Ambulatory Visit (INDEPENDENT_AMBULATORY_CARE_PROVIDER_SITE_OTHER): Payer: BC Managed Care – PPO | Admitting: Nurse Practitioner

## 2022-10-30 ENCOUNTER — Encounter: Payer: Self-pay | Admitting: Nurse Practitioner

## 2022-10-30 VITALS — BP 120/80 | HR 64 | Temp 98.6°F | Ht 62.0 in | Wt 189.4 lb

## 2022-10-30 DIAGNOSIS — Z9884 Bariatric surgery status: Secondary | ICD-10-CM

## 2022-10-30 DIAGNOSIS — F419 Anxiety disorder, unspecified: Secondary | ICD-10-CM | POA: Diagnosis not present

## 2022-10-30 DIAGNOSIS — Z6830 Body mass index (BMI) 30.0-30.9, adult: Secondary | ICD-10-CM | POA: Diagnosis not present

## 2022-10-30 DIAGNOSIS — Z1329 Encounter for screening for other suspected endocrine disorder: Secondary | ICD-10-CM

## 2022-10-30 DIAGNOSIS — E669 Obesity, unspecified: Secondary | ICD-10-CM | POA: Diagnosis not present

## 2022-10-30 DIAGNOSIS — K219 Gastro-esophageal reflux disease without esophagitis: Secondary | ICD-10-CM | POA: Diagnosis not present

## 2022-10-30 DIAGNOSIS — K580 Irritable bowel syndrome with diarrhea: Secondary | ICD-10-CM

## 2022-10-30 DIAGNOSIS — J4530 Mild persistent asthma, uncomplicated: Secondary | ICD-10-CM

## 2022-10-30 DIAGNOSIS — Z13220 Encounter for screening for lipoid disorders: Secondary | ICD-10-CM

## 2022-10-30 NOTE — Progress Notes (Signed)
Kara Dicker, NP-C Phone: [phone]  Kara Tate is a 42 y.o. female who presents today to establish care.   She has been seeing [facility] in [place]. She is currently on Wegovy 0.5 mg weekly, week 2 on this dose. She has a Hx of bariatric surgery. She has been increasing the protein in her diet and having weekly nutrition counseling with [facility]. She is requesting that we take over her Reg[facility] Lutes as she no longer wants to continue seeing [facility]. She denies any side effects from the [facility].   GERD:   Reflux symptoms: None with medication   Abd pain: No   Blood in stool: No  Dysphagia: No   EGD: June 2022  Medication: Protonix 40 mg daily She is interested in stopping her Protonix as she does not feel that she needs medication for acid reflux since losing weight.   Asthma- Denies shortness of breath. Denies wheezing. Using Advair and Singulair daily. She rarely has to use her Albuterol inhaler, usually only with URIs. She continues to take Zyrtec and Flonase daily.   IBS- Using Hyoscyamine and Zofran PRN. Denies diarrhea. Denies constipation. Denies abdominal pains.   Anxiety- PHQ- 2 and GAD- 3 today. Doing well on current medication regimen, Wellbutrin XL 300 mg daily, Clonazepam and Atarax PRN. She followed by Psychiatry and sees a therapist regularly.   Active Ambulatory Problems    Diagnosis Date Noted   Anxiety 03/15/2020   GERD (gastroesophageal reflux disease) 03/15/2020   Irritable bowel syndrome with diarrhea 09/29/2021   Mild persistent asthma without complication 03/15/2020   Obesity (BMI 30-39.9) 11/06/2022   S/P bariatric surgery 03/15/2020   Resolved Ambulatory Problems    Diagnosis Date Noted   No Resolved Ambulatory Problems   Past Medical History:  Diagnosis Date   Allergy    Asthma    Frequent headaches    Hypertension     Family History  Problem Relation Age of Onset   Hyperlipidemia Mother    Diabetes Mother    Depression  Mother    Miscarriages / Stillbirths Maternal Grandmother    Hypertension Maternal Grandmother    Depression Maternal Grandmother    COPD Maternal Grandmother    Asthma Maternal Grandmother    Arthritis Maternal Grandmother    Hypertension Maternal Grandfather    Hyperlipidemia Maternal Grandfather    Cancer Maternal Grandfather    Depression Maternal Grandfather    Arthritis Maternal Grandfather    Heart attack Maternal Grandfather    Stroke Maternal Grandfather     Social History   Socioeconomic History   Marital status: Married    Spouse name: Not on file   Number of children: Not on file   Years of education: Not on file   Highest education level: Not on file  Occupational History   Not on file  Tobacco Use   Smoking status: Never   Smokeless tobacco: Never  Substance and Sexual Activity   Alcohol use: Never   Drug use: Never   Sexual activity: Not on file  Other Topics Concern   Not on file  Social History Narrative   Not on file   Social Determinants of Health   Financial Resource Strain: Not on file  Food Insecurity: Not on file  Transportation Needs: Not on file  Physical Activity: Not on file  Stress: Not on file  Social Connections: Not on file  Intimate Partner Violence: Not on file    ROS  General:  Negative  for unexplained weight loss, fever Skin: Negative for new or changing mole, sore that won't heal HEENT: Negative for trouble hearing, trouble seeing, ringing in ears, mouth sores, hoarseness, change in voice, dysphagia. CV:  Negative for chest pain, dyspnea, edema, palpitations Resp: Negative for cough, dyspnea, hemoptysis GI: Negative for nausea, vomiting, diarrhea, constipation, abdominal pain, melena, hematochezia. GU: Negative for dysuria, incontinence, urinary hesitance, hematuria, vaginal or penile discharge, polyuria, sexual difficulty, lumps in testicle or breasts MSK: Negative for muscle cramps or aches, joint pain or swelling Neuro:  Negative for headaches, weakness, numbness, dizziness, passing out/fainting Psych: Negative for depression, anxiety, memory problems  Objective  Physical Exam Vitals:   10/30/22 0917  BP: 120/80  Pulse: 64  Temp: 98.6 F (37 C)  SpO2: 98%    BP Readings from Last 3 Encounters:  10/30/22 120/80  09/14/20 (!) 130/94  05/19/20 (!) 155/100   Wt Readings from Last 3 Encounters:  10/30/22 189 lb 6.4 oz (85.9 kg)  05/19/20 170 lb (77.1 kg)    Physical Exam Constitutional:      General: She is not in acute distress.    Appearance: Normal appearance.  HENT:     Head: Normocephalic.  Cardiovascular:     Rate and Rhythm: Normal rate and regular rhythm.     Heart sounds: Normal heart sounds.  Pulmonary:     Effort: Pulmonary effort is normal.     Breath sounds: Normal breath sounds.  Skin:    General: Skin is warm and dry.  Neurological:     General: No focal deficit present.     Mental Status: She is alert.  Psychiatric:        Mood and Affect: Mood normal.        Behavior: Behavior normal.    Assessment/Plan:   Obesity (BMI 30-39.9) Assessment & Plan: Continue Wegovy 0.5 mg weekly. Will increase to 1 mg weekly after 4 weeks. Encouraged to continue healthy diet and exercise.   Orders: -     Wegovy; Inject 1 mg into the skin once a week. X 4 weeks  Dispense: 2 mL; Refill: 0 -     Wegovy; Inject 1.7 mg into the skin once a week. X 4 weeks  Dispense: 3 mL; Refill: 0 -     Hemoglobin A1c; Future  Gastroesophageal reflux disease, unspecified whether esophagitis present Assessment & Plan: Chronic. Patient feels that symptoms have resolved since having bariatric surgery and hiatal hernia repaired. Will discontinue Protonix. She will monitor for any symptoms and monitor her diet for triggers such as spicy and acidic foods. Dietary information provided to patient. Advised to contact if symptoms return or are worsening. Will continue to monitor.     Anxiety Assessment &  Plan: Chronic. Stable on current medication regimen- Wellbutrin XL 300 mg daily, Clonazepam and Atarax PRN. Follow up with Psychiatry and therapist as scheduled. GAD- 3 today.    Mild persistent asthma without complication Assessment & Plan: Chronic. Well controlled. Stable on current medication regimen- Advair, Singulair, Zyrtec, Flonase and Albuterol PRN. Continue. Denies any symptoms today. Will continue to monitor.    Irritable bowel syndrome with diarrhea Assessment & Plan: Chronic. Stable at this time. Denies any symptoms. Has PRN medications. Will monitor.   Orders: -     CBC with Differential/Platelet; Future -     Comprehensive metabolic panel; Future  S/P bariatric surgery -     Vitamin B12; Future -     VITAMIN D 25 Hydroxy (Vit-D Deficiency, Fractures);  Future  Thyroid disorder screen -     TSH; Future  Lipid screening -     Lipid panel; Future   Return in about 3 months (around 01/30/2023) for Annual Exam. Please complete labs 2-3 days prior.   Kara Dicker, NP-C [facility] [employer]

## 2022-11-06 ENCOUNTER — Encounter: Payer: Self-pay | Admitting: Nurse Practitioner

## 2022-11-06 DIAGNOSIS — E669 Obesity, unspecified: Secondary | ICD-10-CM | POA: Insufficient documentation

## 2022-11-06 MED ORDER — WEGOVY 1 MG/0.5ML ~~LOC~~ SOAJ
1.0000 mg | SUBCUTANEOUS | 0 refills | Status: DC
Start: 2022-11-06 — End: 2022-12-14

## 2022-11-06 MED ORDER — WEGOVY 1.7 MG/0.75ML ~~LOC~~ SOAJ: 1.7000 mg | SUBCUTANEOUS | 0 refills | Status: DC

## 2022-11-06 NOTE — Assessment & Plan Note (Signed)
Chronic. Patient feels that symptoms have resolved since having bariatric surgery and hiatal hernia repaired. Will discontinue Protonix. She will monitor for any symptoms and monitor her diet for triggers such as spicy and acidic foods. Dietary information provided to patient. Advised to contact if symptoms return or are worsening. Will continue to monitor.

## 2022-11-06 NOTE — Assessment & Plan Note (Signed)
Chronic. Stable on current medication regimen- Wellbutrin XL 300 mg daily, Clonazepam and Atarax PRN. Follow up with Psychiatry and therapist as scheduled. GAD- 3 today.

## 2022-11-06 NOTE — Assessment & Plan Note (Signed)
Chronic. Well controlled. Stable on current medication regimen- Advair, Singulair, Zyrtec, Flonase and Albuterol PRN. Continue. Denies any symptoms today. Will continue to monitor.

## 2022-11-06 NOTE — Assessment & Plan Note (Signed)
Chronic. Stable at this time. Denies any symptoms. Has PRN medications. Will monitor.

## 2022-11-06 NOTE — Assessment & Plan Note (Signed)
Continue Wegovy 0.5 mg weekly. Will increase to 1 mg weekly after 4 weeks. Encouraged to continue healthy diet and exercise.

## 2022-11-13 DIAGNOSIS — L700 Acne vulgaris: Secondary | ICD-10-CM | POA: Diagnosis not present

## 2022-11-13 DIAGNOSIS — L680 Hirsutism: Secondary | ICD-10-CM | POA: Diagnosis not present

## 2022-11-14 ENCOUNTER — Ambulatory Visit
Admission: EM | Admit: 2022-11-14 | Discharge: 2022-11-14 | Disposition: A | Discharge status: Home / Self Care | Payer: BC Managed Care – PPO | Attending: Urgent Care | Admitting: Urgent Care

## 2022-11-14 ENCOUNTER — Encounter: Payer: Self-pay | Admitting: Emergency Medicine

## 2022-11-14 DIAGNOSIS — J020 Streptococcal pharyngitis: Secondary | ICD-10-CM

## 2022-11-14 LAB — POCT RAPID STREP A (OFFICE): Rapid Strep A Screen: POSITIVE — AB

## 2022-11-14 MED ORDER — AMOXICILLIN 500 MG PO CAPS
500.0000 mg | ORAL_CAPSULE | Freq: Two times a day (BID) | ORAL | 0 refills | Status: AC
Start: 2022-11-14 — End: 2022-11-24

## 2022-11-14 NOTE — Discharge Instructions (Signed)
Follow up here or with your primary care provider if your symptoms are worsening or not improving with treatment.     

## 2022-11-14 NOTE — ED Provider Notes (Signed)
Kara Tate    CSN: [id number] Arrival date & time: 11/14/22  0844      History   Chief Complaint Chief Complaint  Patient presents with   Sore Throat    HPI Kara Tate is a 42 y.o. female.    Sore Throat    Presents to UC with sore throat worsening since 4 days.  She denies fever, sick contacts, postnasal drip, cough.  She has been using OTC medication for symptom control including supplemental medications.  She is unable to use ibuprofen to treat her symptoms due to history of weight loss surgery.  PMH includes bariatric surgery.  Mild persistent asthma.  IBS with diarrhea.  Anxiety.  Past Medical History:  Diagnosis Date   Allergy    Asthma    Frequent headaches    Hypertension     Patient Active Problem List   Diagnosis Date Noted   Obesity (BMI 30-39.9) 11/06/2022   Irritable bowel syndrome with diarrhea 09/29/2021   Anxiety 03/15/2020   GERD (gastroesophageal reflux disease) 03/15/2020   Mild persistent asthma without complication 03/15/2020   S/P bariatric surgery 03/15/2020    Past Surgical History:  Procedure Laterality Date   CESAREAN SECTION     GASTRECTOMY     GASTRIC BYPASS     LAPAROSCOPIC GASTRIC BANDING      OB History   No obstetric history on file.      Home Medications    Prior to Admission medications   Medication Sig Start Date End Date Taking? Authorizing Provider  albuterol (VENTOLIN HFA) 108 (90 Base) MCG/ACT inhaler Inhale 2 puffs into the lungs every 4 (four) hours as needed for shortness of breath or wheezing. 07/07/21   [provider]  buPROPion (WELLBUTRIN XL) 300 MG 24 hr tablet Take 300 mg by mouth daily. 08/28/22   [provider]  Cholecalciferol (VITAMIN D-1000 MAX ST) 25 MCG (1000 UT) tablet Take 1,000 Units by mouth daily.    [provider]  clonazePAM (KLONOPIN) 0.25 MG disintegrating tablet Take 0.25 mg by mouth 2 (two) times daily as needed.    [provider]   cyanocobalamin (VITAMIN B12) 1000 MCG tablet Take 1,000 mcg by mouth.    [provider]  fluticasone-salmeterol (ADVAIR) 500-50 MCG/ACT AEPB Inhale 1 puff into the lungs. 07/07/21   [provider]  hydrOXYzine (ATARAX) 25 MG tablet Take 25 mg by mouth every 8 (eight) hours as needed.    [provider]  hyoscyamine (LEVSIN SL) 0.125 MG SL tablet Take 0.25 mg by mouth every 6 (six) hours as needed. 06/27/22   [provider]  meloxicam (MOBIC) 15 MG tablet Take 7.5 mg by mouth as needed for pain.    [provider]  montelukast (SINGULAIR) 10 MG tablet Take 10 mg by mouth at bedtime. 05/17/22   [provider]  naltrexone (DEPADE) 50 MG tablet Take 50 mg by mouth daily. 08/28/22   [provider]  Semaglutide-Weight Management (WEGOVY) 1 MG/0.5ML SOAJ Inject 1 mg into the skin once a week. X 4 weeks 11/06/22   [name], NP  Semaglutide-Weight Management (WEGOVY) 1.7 MG/0.75ML SOAJ Inject 1.7 mg into the skin once a week. X 4 weeks 11/06/22   [name], NP  WEGOVY 0.25 MG/0.5ML SOAJ Inject 0.25 mg into the skin once a week. 09/18/22   [provider]    Family History Family History  Problem Relation Age of Onset   Hyperlipidemia Mother    Diabetes  Mother    Depression Mother    Miscarriages / Stillbirths Maternal Grandmother    Hypertension Maternal Grandmother    Depression Maternal Grandmother    COPD Maternal Grandmother    Asthma Maternal Grandmother    Arthritis Maternal Grandmother    Hypertension Maternal Grandfather    Hyperlipidemia Maternal Grandfather    Cancer Maternal Grandfather    Depression Maternal Grandfather    Arthritis Maternal Grandfather    Heart attack Maternal Grandfather    Stroke Maternal Grandfather     Social History Social History   Tobacco Use   Smoking status: Never   Smokeless tobacco: Never  Substance Use Topics   Alcohol use: Never   Drug use: Never     Allergies    Hydrocodone, Pneumococcal vaccine, and Pneumococcal vaccines   Review of Systems Review of Systems   Physical Exam Triage Vital Signs ED Triage Vitals  Enc Vitals Group     BP 11/14/22 0857 130/82     Pulse Rate 11/14/22 0857 61     Resp 11/14/22 0857 16     Temp 11/14/22 0857 98.4 F (36.9 C)     Temp Source 11/14/22 0857 Oral     SpO2 11/14/22 0857 99 %     Weight --      Height --      Head Circumference --      Peak Flow --      Pain Score 11/14/22 0906 7     Pain Loc --      Pain Edu? --      Excl. in GC? --    No data found.  Updated Vital Signs BP 130/82 (BP Location: Left Arm)   Pulse 61   Temp 98.4 F (36.9 C) (Oral)   Resp 16   LMP 11/04/2022   SpO2 99%   Visual Acuity Right Eye Distance:   Left Eye Distance:   Bilateral Distance:    Right Eye Near:   Left Eye Near:    Bilateral Near:     Physical Exam Vitals reviewed.  Constitutional:      Appearance: She is well-developed. She is not ill-appearing.  HENT:     Mouth/Throat:     Mouth: Mucous membranes are moist.     Pharynx: Posterior oropharyngeal erythema present. No oropharyngeal exudate.  Skin:    General: Skin is warm and dry.  Neurological:     General: No focal deficit present.     Mental Status: She is alert and oriented to person, place, and time.  Psychiatric:        Mood and Affect: Mood normal.        Behavior: Behavior normal.      UC Treatments / Results  Labs (all labs ordered are listed, but only abnormal results are displayed) Labs Reviewed  POCT RAPID STREP A (OFFICE)    EKG   Radiology No results found.  Procedures Procedures (including critical care time)  Medications Ordered in UC Medications - No data to display  Initial Impression / Assessment and Plan / UC Course  I have reviewed the triage vital signs and the nursing notes.  Pertinent labs & imaging results that were available during my care of the patient were reviewed by me and considered  in my medical decision making (see chart for details).   Kara Tate is a 42 y.o. female presenting with pharyngitis. Patient is afebrile without recent antipyretics, satting well on room air. Overall is well appearing ,  well hydrated, without respiratory distress.  There is pharyngeal erythema.  No peritonsillar exudate.  Reviewed relevant chart history.   Rapid strep result is positive.  Treating with antibiotic therapy using amoxicillin.  Also recommending supportive care and use of OTC products for symptom control.  Given she is intolerant of NSAIDs, recommending pain relieving throat sprays and drops to keep her throat irrigated, as well as warm salt water gargles.  Counseled patient on potential for adverse effects with medications prescribed/recommended today, ER and return-to-clinic precautions discussed, patient verbalized understanding and agreement with care plan.  Final Clinical Impressions(s) / UC Diagnoses   Final diagnoses:  None   Discharge Instructions   None    ED Prescriptions   None    PDMP not reviewed this encounter.   Charma Igo, FNP 11/14/22 0930

## 2022-11-14 NOTE — ED Triage Notes (Signed)
Sore throat worsening since Saturday. Denies fever, sick contacts, PND, cough. Taking theraflu, tea, honey, chloraseptic, clove-based oral throat spray, tylenol, dayquil. States she's unable to take ibuprofen d/t hx of weight loss surgery in past.

## 2022-11-21 DIAGNOSIS — R29898 Other symptoms and signs involving the musculoskeletal system: Secondary | ICD-10-CM | POA: Diagnosis not present

## 2022-12-13 ENCOUNTER — Encounter: Payer: Self-pay | Admitting: Nurse Practitioner

## 2022-12-14 ENCOUNTER — Telehealth: Payer: Self-pay

## 2022-12-14 DIAGNOSIS — R29898 Other symptoms and signs involving the musculoskeletal system: Secondary | ICD-10-CM | POA: Diagnosis not present

## 2022-12-14 MED ORDER — ZEPBOUND 2.5 MG/0.5ML ~~LOC~~ SOAJ: 2.5000 mg | SUBCUTANEOUS | 0 refills | Status: DC

## 2022-12-14 NOTE — Telephone Encounter (Signed)
PA needed for [facility] Provider [employer]

## 2022-12-17 ENCOUNTER — Telehealth: Payer: Self-pay

## 2022-12-17 ENCOUNTER — Other Ambulatory Visit (HOSPITAL_COMMUNITY): Payer: Self-pay

## 2022-12-17 NOTE — Telephone Encounter (Signed)
Pharmacy Patient Advocate Encounter   Received notification from Pt Calls Messages that prior authorization for Zepbound 2.5MG /0.5ML pen-injectors is required/requested.   Insurance verification completed.   The patient is insured through [employer] .   Per test claim: APPROVED from 11/17/22 to 08/14/23. Ran test claim, Copay is $24.99. This test claim was processed through [facility]- copay amounts may vary at other pharmacies due to pharmacy/plan contracts, or as the patient moves through the different stages of their insurance plan.

## 2022-12-17 NOTE — Telephone Encounter (Signed)
Pt informed via mychart

## 2022-12-17 NOTE — Telephone Encounter (Signed)
PA request has been Approved. New Encounter created for follow up. For additional info see Pharmacy Prior Auth telephone encounter from 12/17/22.

## 2023-01-04 DIAGNOSIS — R29898 Other symptoms and signs involving the musculoskeletal system: Secondary | ICD-10-CM | POA: Diagnosis not present

## 2023-01-06 ENCOUNTER — Telehealth: Payer: Self-pay | Admitting: Nurse Practitioner

## 2023-01-09 NOTE — Telephone Encounter (Signed)
Lvm for pt to give office a call back in regards to moving up to the next dosage

## 2023-01-10 MED ORDER — ZEPBOUND 2.5 MG/0.5ML ~~LOC~~ SOAJ: 2.5000 mg | SUBCUTANEOUS | 0 refills | Status: DC

## 2023-01-10 NOTE — Telephone Encounter (Signed)
Pt returned Kara Tate CMA call. Note below was read to her. Pt aware and understood. Pt stated the dosage @ 2.5 mg its fine for now. So pt stated to go ahead and sent that over to her pharmacy.   Pt also stated she will talk about side effects concerning this med on her next appt with provider which will be next month.

## 2023-01-10 NOTE — Telephone Encounter (Signed)
Refill has been sent.  °

## 2023-01-14 ENCOUNTER — Telehealth: Payer: Self-pay | Admitting: Nurse Practitioner

## 2023-01-17 ENCOUNTER — Telehealth: Payer: Self-pay | Admitting: Nurse Practitioner

## 2023-01-17 MED ORDER — ZEPBOUND 2.5 MG/0.5ML ~~LOC~~ SOAJ: 2.5000 mg | SUBCUTANEOUS | 0 refills | Status: DC

## 2023-01-17 NOTE — Addendum Note (Signed)
Addended by: Donavan Foil on: 01/17/2023 04:21 PM   Modules accepted: Orders

## 2023-01-17 NOTE — Telephone Encounter (Signed)
Patient just called and said she is out of the medication tirzepatide (ZEPBOUND) 2.5 MG/0.5ML Pen. She doesn't have anymore. She said she did receive medication on August 12th. But she hasn't gotten her injection for this month. [facility] [phone] [facility] - [address], [place] [address] Phone: [phone]  Fax: [phone]  Her number is [phone] if you have any questions.

## 2023-01-17 NOTE — Telephone Encounter (Signed)
Refill sent.

## 2023-01-18 DIAGNOSIS — R29898 Other symptoms and signs involving the musculoskeletal system: Secondary | ICD-10-CM | POA: Diagnosis not present

## 2023-01-21 NOTE — Telephone Encounter (Signed)
Prescription Request  01/21/2023  LOV: 10/30/2022  What is the name of the medication or equipment? zepbound  Have you contacted your pharmacy to request a refill? Yes   Which pharmacy would you like this sent to?  [id number] [address] [place] [address] Phone: [phone] Fax: [phone]    Patient notified that their request is being sent to the clinical staff for review and that they should receive a response within 2 business days.   Please advise at Mobile [phone] (mobile)

## 2023-01-21 NOTE — Telephone Encounter (Signed)
Tried to contact pharmacy phone lines seem to be down will try again later

## 2023-01-22 DIAGNOSIS — L700 Acne vulgaris: Secondary | ICD-10-CM | POA: Diagnosis not present

## 2023-01-22 MED ORDER — TIRZEPATIDE-WEIGHT MANAGEMENT 5 MG/0.5ML ~~LOC~~ SOLN: 5.0000 mg | SUBCUTANEOUS | 0 refills | Status: DC

## 2023-01-22 NOTE — Addendum Note (Signed)
Addended by: Donavan Foil on: 01/22/2023 11:13 AM   Modules accepted: Orders

## 2023-01-23 ENCOUNTER — Telehealth: Payer: Self-pay

## 2023-01-23 DIAGNOSIS — F411 Generalized anxiety disorder: Secondary | ICD-10-CM | POA: Diagnosis not present

## 2023-01-23 NOTE — Telephone Encounter (Signed)
Pt may be need a PA on zepbound for the 5 mg.  2.5 mg was approved already but the pharmacy is stating that the 5mg  is run against the insurance and it is not covered.   Provider [employer]

## 2023-01-25 NOTE — Telephone Encounter (Signed)
Pt called requesting an update on zepbound medication

## 2023-01-29 ENCOUNTER — Other Ambulatory Visit (HOSPITAL_COMMUNITY): Payer: Self-pay

## 2023-01-29 ENCOUNTER — Telehealth: Payer: Self-pay | Admitting: Nurse Practitioner

## 2023-01-29 ENCOUNTER — Other Ambulatory Visit: Payer: Self-pay

## 2023-01-29 MED ORDER — ZEPBOUND 5 MG/0.5ML ~~LOC~~ SOAJ
5.0000 mg | SUBCUTANEOUS | 0 refills | Status: DC
  Filled 2023-01-29 (×5): qty 2, 28d supply, fill #0

## 2023-01-29 NOTE — Telephone Encounter (Signed)
Spoke with pt and got her Rx sent in correctly and sent to [facility] which pt was okay with provided pt with address and phone number to [facility] pharmacy

## 2023-01-29 NOTE — Telephone Encounter (Signed)
Patient would like a phone call back from CMA about her Tirzepatide-Weight Management 5 MG/0.5ML SOLN . Patient states the wrong type of medication was called in and she has info to give to [facility] about her pharmacy.

## 2023-01-29 NOTE — Telephone Encounter (Signed)
Called and spoke with pt , info documented under phone call

## 2023-01-29 NOTE — Telephone Encounter (Signed)
PA request has been Approved. New Encounter created for follow up. For additional info see Pharmacy Prior Auth telephone encounter from 12/17/22.   Ran test claim for 5mg , received paid claim. No additional PA needed.

## 2023-01-30 ENCOUNTER — Other Ambulatory Visit: Payer: Self-pay

## 2023-02-04 ENCOUNTER — Other Ambulatory Visit: Payer: BC Managed Care – PPO

## 2023-02-04 ENCOUNTER — Other Ambulatory Visit (INDEPENDENT_AMBULATORY_CARE_PROVIDER_SITE_OTHER): Payer: BC Managed Care – PPO

## 2023-02-04 DIAGNOSIS — Z1329 Encounter for screening for other suspected endocrine disorder: Secondary | ICD-10-CM | POA: Diagnosis not present

## 2023-02-04 DIAGNOSIS — K580 Irritable bowel syndrome with diarrhea: Secondary | ICD-10-CM | POA: Diagnosis not present

## 2023-02-04 DIAGNOSIS — Z13220 Encounter for screening for lipoid disorders: Secondary | ICD-10-CM | POA: Diagnosis not present

## 2023-02-04 DIAGNOSIS — E669 Obesity, unspecified: Secondary | ICD-10-CM

## 2023-02-04 DIAGNOSIS — Z9884 Bariatric surgery status: Secondary | ICD-10-CM | POA: Diagnosis not present

## 2023-02-04 LAB — COMPREHENSIVE METABOLIC PANEL
ALT: 12 U/L (ref 0–35)
AST: 18 U/L (ref 0–37)
Albumin: 3.9 g/dL (ref 3.5–5.2)
Alkaline Phosphatase: 65 U/L (ref 39–117)
BUN: 8 mg/dL (ref 6–23)
CO2: 27 meq/L (ref 19–32)
Calcium: 9.5 mg/dL (ref 8.4–10.5)
Chloride: 104 meq/L (ref 96–112)
Creatinine, Ser: 0.85 mg/dL (ref 0.40–1.20)
GFR: 86.09 mL/min (ref >60.00)
Glucose, Bld: 78 mg/dL (ref 70–99)
Potassium: 4 meq/L (ref 3.5–5.1)
Sodium: 137 meq/L (ref 135–145)
Total Bilirubin: 0.6 mg/dL (ref 0.2–1.2)
Total Protein: 6.9 g/dL (ref 6.0–8.3)

## 2023-02-04 LAB — CBC WITH DIFFERENTIAL/PLATELET
Basophils Absolute: 0.1 10*3/uL (ref 0.0–0.1)
Basophils Relative: 0.8 % (ref 0.0–3.0)
Eosinophils Absolute: 0.1 10*3/uL (ref 0.0–0.7)
Eosinophils Relative: 0.8 % (ref 0.0–5.0)
HCT: 36.7 % (ref 36.0–46.0)
Hemoglobin: 12.1 g/dL (ref 12.0–15.0)
Lymphocytes Relative: 32.5 % (ref 12.0–46.0)
Lymphs Abs: 2.1 10*3/uL (ref 0.7–4.0)
MCHC: 33 g/dL (ref 30.0–36.0)
MCV: 89.4 fL (ref 78.0–100.0)
Monocytes Absolute: 0.6 10*3/uL (ref 0.1–1.0)
Monocytes Relative: 8.7 % (ref 3.0–12.0)
Neutro Abs: 3.7 10*3/uL (ref 1.4–7.7)
Neutrophils Relative %: 57.2 % (ref 43.0–77.0)
Platelets: 330 10*3/uL (ref 150.0–400.0)
RBC: 4.11 Mil/uL (ref 3.87–5.11)
RDW: 14 % (ref 11.5–15.5)
WBC: 6.4 10*3/uL (ref 4.0–10.5)

## 2023-02-04 LAB — VITAMIN D 25 HYDROXY (VIT D DEFICIENCY, FRACTURES): VITD: 63.96 ng/mL (ref 30.00–100.00)

## 2023-02-04 LAB — LIPID PANEL
Cholesterol: 157 mg/dL (ref 0–200)
HDL: 50.6 mg/dL (ref >39.00)
LDL Cholesterol: 97 mg/dL (ref 0–99)
NonHDL: 106.29
Total CHOL/HDL Ratio: 3
Triglycerides: 46 mg/dL (ref 0.0–149.0)
VLDL: 9.2 mg/dL (ref 0.0–40.0)

## 2023-02-04 LAB — VITAMIN B12: Vitamin B-12: >1501 pg/mL — ABNORMAL HIGH (ref 211–911)

## 2023-02-04 LAB — HEMOGLOBIN A1C: Hgb A1c MFr Bld: 5.3 % (ref 4.6–6.5)

## 2023-02-04 LAB — TSH: TSH: 2.39 u[IU]/mL (ref 0.35–5.50)

## 2023-02-05 DIAGNOSIS — F411 Generalized anxiety disorder: Secondary | ICD-10-CM | POA: Diagnosis not present

## 2023-02-06 NOTE — Progress Notes (Unsigned)
Kara Dicker, NP-C Phone: [phone]  Kara Tate is a 42 y.o. female who presents today for annual exam. She has no complaints or new concerns today. She is doing well on her medications. She completed lab work prior to her appointment and would like to review the results today.   Diet: Well balanced, increased protein, cooks at home, decreased snacking Exercise: Peloton workouts, bike and strength training 3-5 days per week Pap smear: 02/08/2021- seeing Ob-Gyn next week Mammogram: Due in December Family history-  Colon cancer: No  Breast cancer: No  Ovarian cancer: No Menses: LMP- irregular, has IUD Sexually active: Yes Vaccines-   Flu: Today  Tetanus: 05/08/2015  COVID19: x 3  HIV screening: Deferred Hep C Screening: Deferred Tobacco use: No Alcohol use: No Illicit Drug use: No Dentist: Yes Ophthalmology: Yes   Social History   Tobacco Use  Smoking Status Never  Smokeless Tobacco Never    Current Outpatient Medications on File Prior to Visit  Medication Sig Dispense Refill   albuterol (VENTOLIN HFA) 108 (90 Base) MCG/ACT inhaler Inhale 2 puffs into the lungs every 4 (four) hours as needed for shortness of breath or wheezing.     buPROPion (WELLBUTRIN XL) 300 MG 24 hr tablet Take 300 mg by mouth daily.     Cholecalciferol (VITAMIN D-1000 MAX ST) 25 MCG (1000 UT) tablet Take 1,000 Units by mouth daily.     clonazePAM (KLONOPIN) 0.25 MG disintegrating tablet Take 0.25 mg by mouth 2 (two) times daily as needed.     cyanocobalamin (VITAMIN B12) 1000 MCG tablet Take 1,000 mcg by mouth.     fluticasone-salmeterol (ADVAIR) 500-50 MCG/ACT AEPB Inhale 1 puff into the lungs.     hydrOXYzine (ATARAX) 25 MG tablet Take 25 mg by mouth every 8 (eight) hours as needed.     hyoscyamine (LEVSIN SL) 0.125 MG SL tablet Take 0.25 mg by mouth every 6 (six) hours as needed.     meloxicam (MOBIC) 15 MG tablet Take 7.5 mg by mouth as needed for pain.     montelukast (SINGULAIR) 10 MG  tablet Take 10 mg by mouth at bedtime.     spironolactone (ALDACTONE) 50 MG tablet Take 50 mg by mouth 2 (two) times daily.     No current facility-administered medications on file prior to visit.    ROS see history of present illness  Objective  Physical Exam Vitals:   02/07/23 0832  BP: 110/70  Temp: 98 F (36.7 C)    BP Readings from Last 3 Encounters:  02/07/23 110/70  11/14/22 130/82  10/30/22 120/80   Wt Readings from Last 3 Encounters:  02/07/23 168 lb 9.6 oz (76.5 kg)  10/30/22 189 lb 6.4 oz (85.9 kg)  05/19/20 170 lb (77.1 kg)    Physical Exam Constitutional:      General: She is not in acute distress.    Appearance: Normal appearance.  HENT:     Head: Normocephalic.     Right Ear: Tympanic membrane normal.     Left Ear: Tympanic membrane normal.     Nose: Nose normal.     Mouth/Throat:     Mouth: Mucous membranes are moist.     Pharynx: Oropharynx is clear.  Eyes:     Conjunctiva/sclera: Conjunctivae normal.     Pupils: Pupils are equal, round, and reactive to light.  Neck:     Thyroid: No thyromegaly.  Cardiovascular:     Rate and Rhythm: Normal rate and regular rhythm.  Heart sounds: Normal heart sounds.  Pulmonary:     Effort: Pulmonary effort is normal.     Breath sounds: Normal breath sounds.  Abdominal:     General: Abdomen is flat. Bowel sounds are normal.     Palpations: Abdomen is soft. There is no mass.     Tenderness: There is no abdominal tenderness.  Musculoskeletal:        General: Normal range of motion.  Lymphadenopathy:     Cervical: No cervical adenopathy.  Skin:    General: Skin is warm and dry.     Findings: No rash.  Neurological:     General: No focal deficit present.     Mental Status: She is alert.  Psychiatric:        Mood and Affect: Mood normal.        Behavior: Behavior normal.    Assessment/Plan: Please see individual problem list.  Preventative health care Assessment & Plan: Physical exam complete.  Lab results reviewed with patient. Pap- UTD. Mammogram- order placed, encouraged patient to call to schedule for December when due. Flu vaccine- given today in office. Tetanus vaccine- UTD. Declined additional COVID vaccines. HIV and Hep C screenings deferred. Recommended follow ups with Dentist and Ophthalmology for annual exams. Encouraged to continue healthy diet and exercise. Return to care in one year, sooner PRN.    B12 deficiency Assessment & Plan: Has been taking OTC daily supplement. B12 elevated in recent lab work. She will decrease the mcg of her supplement and return in 6 weeks to have her B12 level rechecked.    Obesity (BMI 30-39.9) Assessment & Plan: Currently on Zepbound 5 mg weekly. She would like to stay at this dose to maintain her weight. Refills sent. Denies side effects. Will continue to monitor. Encouraged to continue healthy diet and exercise.   Orders: -     Zepbound; Inject 5 mg into the skin once a week.  Dispense: 6 mL; Refill: 1  Screening mammogram for breast cancer -     3D Screening Mammogram, Left and Right; Future  Need for influenza vaccination -     Flu vaccine trivalent PF, 6mos and older(Flulaval,Afluria,Fluarix,Fluzone)   Return in about 1 year (around 02/07/2024) for Annual Exam, sooner PRN.   Kara Dicker, NP-C [facility] - [employer]

## 2023-02-07 ENCOUNTER — Encounter: Payer: Self-pay | Admitting: Nurse Practitioner

## 2023-02-07 ENCOUNTER — Other Ambulatory Visit: Payer: Self-pay

## 2023-02-07 ENCOUNTER — Ambulatory Visit (INDEPENDENT_AMBULATORY_CARE_PROVIDER_SITE_OTHER): Payer: BC Managed Care – PPO | Admitting: Nurse Practitioner

## 2023-02-07 VITALS — BP 110/70 | Temp 98.0°F | Ht 62.0 in | Wt 168.6 lb

## 2023-02-07 DIAGNOSIS — Z0000 Encounter for general adult medical examination without abnormal findings: Secondary | ICD-10-CM | POA: Insufficient documentation

## 2023-02-07 DIAGNOSIS — E669 Obesity, unspecified: Secondary | ICD-10-CM

## 2023-02-07 DIAGNOSIS — Z23 Encounter for immunization: Secondary | ICD-10-CM

## 2023-02-07 DIAGNOSIS — E538 Deficiency of other specified B group vitamins: Secondary | ICD-10-CM | POA: Diagnosis not present

## 2023-02-07 DIAGNOSIS — Z1231 Encounter for screening mammogram for malignant neoplasm of breast: Secondary | ICD-10-CM | POA: Diagnosis not present

## 2023-02-07 MED ORDER — ZEPBOUND 5 MG/0.5ML ~~LOC~~ SOAJ
5.0000 mg | SUBCUTANEOUS | 1 refills | Status: DC
  Filled 2023-02-07: qty 6, 84d supply, fill #0
  Filled 2023-03-01 – 2023-03-04 (×2): qty 2, 28d supply, fill #0

## 2023-02-07 MED ORDER — ZEPBOUND 5 MG/0.5ML ~~LOC~~ SOAJ
5.0000 mg | SUBCUTANEOUS | 1 refills | Status: DC
Start: 2023-02-07 — End: 2023-02-07

## 2023-02-07 NOTE — Patient Instructions (Signed)
YOUR MAMMOGRAM IS DUE, PLEASE CALL AND GET THIS SCHEDULED! [facility] - call [phone]    

## 2023-02-07 NOTE — Assessment & Plan Note (Signed)
Physical exam complete. Lab results reviewed with patient. Pap- UTD. Mammogram- order placed, encouraged patient to call to schedule for December when due. Flu vaccine- given today in office. Tetanus vaccine- UTD. Declined additional COVID vaccines. HIV and Hep C screenings deferred. Recommended follow ups with Dentist and Ophthalmology for annual exams. Encouraged to continue healthy diet and exercise. Return to care in one year, sooner PRN.

## 2023-02-07 NOTE — Assessment & Plan Note (Signed)
Has been taking OTC daily supplement. B12 elevated in recent lab work. She will decrease the mcg of her supplement and return in 6 weeks to have her B12 level rechecked.

## 2023-02-07 NOTE — Assessment & Plan Note (Addendum)
Currently on Zepbound 5 mg weekly. She would like to stay at this dose to maintain her weight. Refills sent. Denies side effects. Will continue to monitor. Encouraged to continue healthy diet and exercise.

## 2023-02-18 ENCOUNTER — Ambulatory Visit
Admission: EM | Admit: 2023-02-18 | Discharge: 2023-02-18 | Disposition: A | Discharge status: Home / Self Care | Payer: BC Managed Care – PPO | Attending: Physician Assistant | Admitting: Physician Assistant

## 2023-02-18 DIAGNOSIS — R197 Diarrhea, unspecified: Secondary | ICD-10-CM | POA: Diagnosis not present

## 2023-02-18 DIAGNOSIS — K529 Noninfective gastroenteritis and colitis, unspecified: Secondary | ICD-10-CM

## 2023-02-18 DIAGNOSIS — R109 Unspecified abdominal pain: Secondary | ICD-10-CM | POA: Diagnosis not present

## 2023-02-18 DIAGNOSIS — R112 Nausea with vomiting, unspecified: Secondary | ICD-10-CM | POA: Diagnosis not present

## 2023-02-18 LAB — POCT URINALYSIS DIP (MANUAL ENTRY)
Glucose, UA: NEGATIVE mg/dL
Leukocytes, UA: NEGATIVE
Nitrite, UA: NEGATIVE
Protein Ur, POC: 100 mg/dL — AB
Spec Grav, UA: >=1.03 — AB (ref 1.010–1.025)
Urobilinogen, UA: 0.2 U/dL
pH, UA: 6 (ref 5.0–8.0)

## 2023-02-18 LAB — POCT URINE PREGNANCY: Preg Test, Ur: NEGATIVE

## 2023-02-18 MED ORDER — DICYCLOMINE HCL 20 MG PO TABS: 20.0000 mg | ORAL_TABLET | Freq: Two times a day (BID) | ORAL | 0 refills | Status: DC

## 2023-02-18 MED ORDER — ONDANSETRON 4 MG PO TBDP: 4.0000 mg | ORAL_TABLET | Freq: Three times a day (TID) | ORAL | 0 refills | Status: AC | PRN

## 2023-02-18 MED ORDER — ONDANSETRON 4 MG PO TBDP
4.0000 mg | ORAL_TABLET | Freq: Once | ORAL | Status: AC
  Administered 2023-02-18: 4 mg via ORAL

## 2023-02-18 NOTE — ED Triage Notes (Signed)
Patient to Urgent Care with complaints of NVD and crampy abdominal pain. Episodes of intermittent. Reports feeling weak and dizzy during episodes.  Symptoms started Saturday after drinking coffee. Reports feeling better on Sunday but symptoms returned again today.  Taking zepbound- increased dosage on Saturday. Taking immodium/ hyocyamine.

## 2023-02-18 NOTE — Discharge Instructions (Signed)
I am glad that you are able to tolerate fluid and food.  Use Zofran on a scheduled basis (every 8 hours) for the next several days.  Take dicyclomine twice daily to help with abdominal cramping.  Eat a bland diet and avoid spicy/acidic/fatty foods.  I will contact you if any of your blood work is abnormal.  Make sure you are drinking plenty of fluid.  Do not take your Zepbound again until you contact and develop a plan with your prescriber.  If your symptoms are not improving within a few days please return for reevaluation.  If anything worsens and you have severe abdominal pain, blood in your stool, blood in your vomit, nausea/vomiting despite medication, weakness you need to go to the ER.

## 2023-02-18 NOTE — ED Provider Notes (Signed)
[name]    CSN: [id number] Arrival date & time: 02/18/23  1438      History   Chief Complaint Chief Complaint  Patient presents with   Abdominal Pain    HPI Argentina Kosch is a 42 y.o. female.   Patient presents today with a 2 to 3-day history of GI symptoms.  She reports that during episode she developed severe abdominal cramping that ranges in pain from 5-9/10 on a 0-10 pain scale.  This then leads to nausea, vomiting, diarrhea.  She reports that symptoms began after she had coffee but denies any additional suspicious food intake or dietary changes.  She is currently on Zepbound and recently increased the dose to 5 mg weekly approximately 3 weeks ago; her last injection was the day symptoms began.  She does have a history of irritable bowel and has been taking Imodium as well as Levsin.  These have been ineffective in managing her symptoms.  She has had gastric bypass but denies additional abdominal surgeries with the exception of cesarean sections.  She denies any urinary or vaginal symptoms.  She denies any known sick contacts, recent travel, antibiotic use.  She denies any hematemesis, melena, hematochezia.  She does report decreased oral intake as result of her symptoms.    Past Medical History:  Diagnosis Date   Allergy    Asthma    Frequent headaches    Hypertension     Patient Active Problem List   Diagnosis Date Noted   Preventative health care 02/07/2023   B12 deficiency 02/07/2023   Obesity (BMI 30-39.9) 11/06/2022   Irritable bowel syndrome with diarrhea 09/29/2021   Anxiety 03/15/2020   GERD (gastroesophageal reflux disease) 03/15/2020   Mild persistent asthma without complication 03/15/2020   S/P bariatric surgery 03/15/2020    Past Surgical History:  Procedure Laterality Date   CESAREAN SECTION     GASTRECTOMY     GASTRIC BYPASS     LAPAROSCOPIC GASTRIC BANDING      OB History   No obstetric history on file.      Home  Medications    Prior to Admission medications   Medication Sig Start Date End Date Taking? Authorizing Provider  dicyclomine (BENTYL) 20 MG tablet Take 1 tablet (20 mg total) by mouth 2 (two) times daily. 02/18/23  Yes Claretta Kendra K, PA-C  ondansetron (ZOFRAN-ODT) 4 MG disintegrating tablet Take 1 tablet (4 mg total) by mouth every 8 (eight) hours as needed for nausea or vomiting. 02/18/23  Yes Makiah Foye K, PA-C  albuterol (VENTOLIN HFA) 108 (90 Base) MCG/ACT inhaler Inhale 2 puffs into the lungs every 4 (four) hours as needed for shortness of breath or wheezing. 07/07/21   [provider]  buPROPion (WELLBUTRIN XL) 300 MG 24 hr tablet Take 300 mg by mouth daily. 08/28/22   [provider]  Cholecalciferol (VITAMIN D-1000 MAX ST) 25 MCG (1000 UT) tablet Take 1,000 Units by mouth daily.    [provider]  clonazePAM (KLONOPIN) 0.25 MG disintegrating tablet Take 0.25 mg by mouth 2 (two) times daily as needed.    [provider]  cyanocobalamin (VITAMIN B12) 1000 MCG tablet Take 1,000 mcg by mouth.    [provider]  fluticasone-salmeterol (ADVAIR) 500-50 MCG/ACT AEPB Inhale 1 puff into the lungs. 07/07/21   [provider]  hydrOXYzine (ATARAX) 25 MG tablet Take 25 mg by mouth every 8 (eight) hours as needed.    [provider]  hyoscyamine (LEVSIN SL)  0.125 MG SL tablet Take 0.25 mg by mouth every 6 (six) hours as needed. 06/27/22   [provider]  meloxicam (MOBIC) 15 MG tablet Take 7.5 mg by mouth as needed for pain.    [provider]  montelukast (SINGULAIR) 10 MG tablet Take 10 mg by mouth at bedtime. 05/17/22   [provider]  spironolactone (ALDACTONE) 50 MG tablet Take 50 mg by mouth 2 (two) times daily. 02/07/23   [provider]  tirzepatide (ZEPBOUND) 5 MG/0.5ML Pen Inject 5 mg into the skin once a week. 02/07/23   [name], NP    Family History Family History  Problem Relation Age of  Onset   Hyperlipidemia Mother    Diabetes Mother    Depression Mother    Miscarriages / Stillbirths Maternal Grandmother    Hypertension Maternal Grandmother    Depression Maternal Grandmother    COPD Maternal Grandmother    Asthma Maternal Grandmother    Arthritis Maternal Grandmother    Hypertension Maternal Grandfather    Hyperlipidemia Maternal Grandfather    Cancer Maternal Grandfather    Depression Maternal Grandfather    Arthritis Maternal Grandfather    Heart attack Maternal Grandfather    Stroke Maternal Grandfather     Social History Social History   Tobacco Use   Smoking status: Never   Smokeless tobacco: Never  Substance Use Topics   Alcohol use: Never   Drug use: Never     Allergies   Hydrocodone, Pneumococcal vaccine, and Pneumococcal vaccines   Review of Systems Review of Systems  Constitutional:  Positive for activity change and appetite change. Negative for fatigue and fever.  Respiratory:  Negative for cough and shortness of breath.   Cardiovascular:  Negative for chest pain.  Gastrointestinal:  Positive for abdominal pain, diarrhea, nausea and vomiting. Negative for blood in stool and constipation.  Genitourinary:  Negative for dysuria, frequency, urgency, vaginal bleeding, vaginal discharge and vaginal pain.     Physical Exam Triage Vital Signs ED Triage Vitals  Encounter Vitals Group     BP 02/18/23 1550 122/80     Systolic BP Percentile --      Diastolic BP Percentile --      Pulse Rate 02/18/23 1550 77     Resp 02/18/23 1550 18     Temp 02/18/23 1550 98.4 F (36.9 C)     Temp src --      SpO2 02/18/23 1550 98 %     Weight --      Height --      Head Circumference --      Peak Flow --      Pain Score 02/18/23 1552 5     Pain Loc --      Pain Education --      Exclude from Growth Chart --    No data found.  Updated Vital Signs BP 122/80   Pulse 77   Temp 98.4 F (36.9 C)   Resp 18   SpO2 98%   Visual Acuity Right Eye  Distance:   Left Eye Distance:   Bilateral Distance:    Right Eye Near:   Left Eye Near:    Bilateral Near:     Physical Exam Vitals reviewed.  Constitutional:      General: She is awake. She is not in acute distress.    Appearance: Normal appearance. She is well-developed. She is not ill-appearing.     Comments: Very pleasant female appears stated age in  no acute distress sitting comfortably in exam room  HENT:     Head: Normocephalic and atraumatic.  Cardiovascular:     Rate and Rhythm: Normal rate and regular rhythm.     Heart sounds: Normal heart sounds, S1 normal and S2 normal. No murmur heard. Pulmonary:     Effort: Pulmonary effort is normal.     Breath sounds: Normal breath sounds. No wheezing, rhonchi or rales.     Comments: Clear to auscultation bilaterally Abdominal:     General: Bowel sounds are normal.     Palpations: Abdomen is soft.     Tenderness: There is generalized abdominal tenderness. There is no right CVA tenderness, left CVA tenderness, guarding or rebound. Negative signs include Murphy's sign.     Comments: Mild tenderness palpation throughout abdomen.  No evidence of acute abdomen on physical exam.   Psychiatric:        Behavior: Behavior is cooperative.      UC Treatments / Results  Labs (all labs ordered are listed, but only abnormal results are displayed) Labs Reviewed  POCT URINALYSIS DIP (MANUAL ENTRY) - Abnormal; Notable for the following components:      Result Value   Bilirubin, UA moderate (*)    Ketones, POC UA >= (160) (*)    Spec Grav, UA >=1.030 (*)    Blood, UA small (*)    Protein Ur, POC =100 (*)    All other components within normal limits  CBC WITH DIFFERENTIAL/PLATELET  COMPREHENSIVE METABOLIC PANEL  LIPASE  POCT URINE PREGNANCY    EKG   Radiology No results found.  Procedures Procedures (including critical care time)  Medications Ordered in UC Medications  ondansetron (ZOFRAN-ODT) disintegrating tablet 4 mg (4  mg Oral Given 02/18/23 1616)    Initial Impression / Assessment and Plan / UC Course  I have reviewed the triage vital signs and the nursing notes.  Pertinent labs & imaging results that were available during my care of the patient were reviewed by me and considered in my medical decision making (see chart for details).     Patient is well-appearing, afebrile, nontoxic, nontachycardic.  Vital signs of physical exam are reassuring with no indication for emergent evaluation or imaging.  She was given Zofran in clinic and able to pass oral challenge by eating graham crackers and drinking some water.  She was sent home with Zofran as well as dicyclomine to help manage her symptoms.  CBC, CMP, lipase were obtained and are pending.  We will contact her if this is abnormal.  Discussed symptoms could be related to a viral illness or stepdown use.  Recommended that she hold her Zepbound dose until she is able to follow-up with the prescriber to determine if this could be contributing to her symptoms.  She is to eat a bland diet and drink plenty of fluids.  Urine pregnancy was negative.  UA showed evidence of decreased oral intake but was otherwise normal.  We discussed that if her symptoms are not improving quickly (within a few days) that she should return for reevaluation.  Discussed that if anything worsens and she has severe abdominal pain, melena, hematochezia, nausea/vomiting interfering with oral intake despite antiemetic medication, weakness she needs to go to the ER.  Strict return precautions given.  Work excuse note provided.  Final Clinical Impressions(s) / UC Diagnoses   Final diagnoses:  Gastroenteritis  Nausea vomiting and diarrhea  Abdominal cramping     Discharge Instructions  I am glad that you are able to tolerate fluid and food.  Use Zofran on a scheduled basis (every 8 hours) for the next several days.  Take dicyclomine twice daily to help with abdominal cramping.  Eat a bland  diet and avoid spicy/acidic/fatty foods.  I will contact you if any of your blood work is abnormal.  Make sure you are drinking plenty of fluid.  Do not take your Zepbound again until you contact and develop a plan with your prescriber.  If your symptoms are not improving within a few days please return for reevaluation.  If anything worsens and you have severe abdominal pain, blood in your stool, blood in your vomit, nausea/vomiting despite medication, weakness you need to go to the ER.     ED Prescriptions     Medication Sig Dispense Auth. Provider   ondansetron (ZOFRAN-ODT) 4 MG disintegrating tablet Take 1 tablet (4 mg total) by mouth every 8 (eight) hours as needed for nausea or vomiting. 20 tablet Sade Mehlhoff K, PA-C   dicyclomine (BENTYL) 20 MG tablet Take 1 tablet (20 mg total) by mouth 2 (two) times daily. 20 tablet Shelbylynn Walczyk, Noberto Retort, PA-C      PDMP not reviewed this encounter.   Jeani Hawking, PA-C 02/18/23 1723

## 2023-02-19 LAB — CBC WITH DIFFERENTIAL/PLATELET
Basophils Absolute: 0 10*3/uL (ref 0.0–0.2)
Basos: 1 %
EOS (ABSOLUTE): 0 10*3/uL (ref 0.0–0.4)
Eos: 0 %
Hematocrit: 44 % (ref 34.0–46.6)
Hemoglobin: 14.3 g/dL (ref 11.1–15.9)
Immature Grans (Abs): 0 10*3/uL (ref 0.0–0.1)
Immature Granulocytes: 0 %
Lymphocytes Absolute: 1 10*3/uL (ref 0.7–3.1)
Lymphs: 12 %
MCH: 29.1 pg (ref 26.6–33.0)
MCHC: 32.5 g/dL (ref 31.5–35.7)
MCV: 90 fL (ref 79–97)
Monocytes Absolute: 0.4 10*3/uL (ref 0.1–0.9)
Monocytes: 5 %
Neutrophils Absolute: 6.7 10*3/uL (ref 1.4–7.0)
Neutrophils: 82 %
Platelets: 452 10*3/uL — ABNORMAL HIGH (ref 150–450)
RBC: 4.91 x10E6/uL (ref 3.77–5.28)
RDW: 13 % (ref 11.7–15.4)
WBC: 8.2 10*3/uL (ref 3.4–10.8)

## 2023-02-19 LAB — COMPREHENSIVE METABOLIC PANEL
ALT: 9 [IU]/L (ref 0–32)
AST: 16 [IU]/L (ref 0–40)
Albumin: 4.5 g/dL (ref 3.9–4.9)
Alkaline Phosphatase: 97 [IU]/L (ref 44–121)
BUN/Creatinine Ratio: 11 (ref 9–23)
BUN: 10 mg/dL (ref 6–20)
Bilirubin Total: 0.4 mg/dL (ref 0.0–1.2)
CO2: 17 mmol/L — ABNORMAL LOW (ref 20–29)
Calcium: 9.3 mg/dL (ref 8.7–10.2)
Chloride: 101 mmol/L (ref 96–106)
Creatinine, Ser: 0.94 mg/dL (ref 0.57–1.00)
Globulin, Total: 3.1 g/dL (ref 1.5–4.5)
Glucose: 96 mg/dL (ref 70–99)
Potassium: 3.8 mmol/L (ref 3.5–5.2)
Sodium: 138 mmol/L (ref 134–144)
Total Protein: 7.6 g/dL (ref 6.0–8.5)
eGFR: 79 mL/min/{1.73_m2} (ref >59)

## 2023-02-19 LAB — LIPASE: Lipase: 57 U/L (ref 14–72)

## 2023-02-24 ENCOUNTER — Encounter: Payer: Self-pay | Admitting: Nurse Practitioner

## 2023-02-24 DIAGNOSIS — E669 Obesity, unspecified: Secondary | ICD-10-CM

## 2023-03-01 ENCOUNTER — Encounter: Payer: Self-pay | Admitting: Nurse Practitioner

## 2023-03-01 ENCOUNTER — Other Ambulatory Visit: Payer: Self-pay | Admitting: Nurse Practitioner

## 2023-03-01 ENCOUNTER — Other Ambulatory Visit: Payer: Self-pay

## 2023-03-01 DIAGNOSIS — J4530 Mild persistent asthma, uncomplicated: Secondary | ICD-10-CM

## 2023-03-01 MED ORDER — FLUTICASONE-SALMETEROL 500-50 MCG/ACT IN AEPB
1.0000 | INHALATION_SPRAY | Freq: Two times a day (BID) | RESPIRATORY_TRACT | 11 refills | Status: DC
Start: 2023-03-01 — End: 2023-08-07

## 2023-03-04 ENCOUNTER — Other Ambulatory Visit: Payer: Self-pay | Admitting: Nurse Practitioner

## 2023-03-04 ENCOUNTER — Other Ambulatory Visit: Payer: Self-pay

## 2023-03-04 DIAGNOSIS — K580 Irritable bowel syndrome with diarrhea: Secondary | ICD-10-CM

## 2023-03-04 MED ORDER — ZEPBOUND 5 MG/0.5ML ~~LOC~~ SOAJ
5.0000 mg | SUBCUTANEOUS | 1 refills | Status: DC
  Filled 2023-03-04: qty 2, 28d supply, fill #0

## 2023-03-04 MED ORDER — ZEPBOUND 5 MG/0.5ML ~~LOC~~ SOAJ: 5.0000 mg | SUBCUTANEOUS | 1 refills | Status: DC

## 2023-03-04 NOTE — Addendum Note (Signed)
Addended by: Donavan Foil on: 03/04/2023 04:51 PM   Modules accepted: Orders

## 2023-03-06 ENCOUNTER — Other Ambulatory Visit: Payer: Self-pay

## 2023-03-06 MED ORDER — HYOSCYAMINE SULFATE 0.125 MG SL SUBL
0.2500 mg | SUBLINGUAL_TABLET | Freq: Four times a day (QID) | SUBLINGUAL | 5 refills | Status: DC | PRN
  Filled 2023-03-06: qty 30, 4d supply, fill #0

## 2023-03-12 ENCOUNTER — Other Ambulatory Visit: Payer: Self-pay | Admitting: Nurse Practitioner

## 2023-03-12 DIAGNOSIS — Z1231 Encounter for screening mammogram for malignant neoplasm of breast: Secondary | ICD-10-CM

## 2023-03-18 ENCOUNTER — Other Ambulatory Visit: Payer: Self-pay

## 2023-03-22 ENCOUNTER — Encounter: Payer: Self-pay | Admitting: Nurse Practitioner

## 2023-03-22 ENCOUNTER — Other Ambulatory Visit: Payer: Self-pay | Admitting: Nurse Practitioner

## 2023-03-22 ENCOUNTER — Other Ambulatory Visit: Payer: BC Managed Care – PPO

## 2023-03-22 DIAGNOSIS — J4530 Mild persistent asthma, uncomplicated: Secondary | ICD-10-CM

## 2023-03-22 DIAGNOSIS — K580 Irritable bowel syndrome with diarrhea: Secondary | ICD-10-CM

## 2023-03-22 MED ORDER — HYOSCYAMINE SULFATE 0.125 MG SL SUBL: 0.2500 mg | SUBLINGUAL_TABLET | Freq: Four times a day (QID) | SUBLINGUAL | 5 refills | Status: DC | PRN

## 2023-03-25 MED ORDER — ALBUTEROL SULFATE HFA 108 (90 BASE) MCG/ACT IN AERS
2.0000 | INHALATION_SPRAY | RESPIRATORY_TRACT | 5 refills | Status: DC | PRN
Start: 2023-03-25 — End: 2023-08-07

## 2023-03-26 ENCOUNTER — Other Ambulatory Visit: Payer: Self-pay | Admitting: Nurse Practitioner

## 2023-03-26 ENCOUNTER — Telehealth: Payer: Self-pay | Admitting: Nurse Practitioner

## 2023-03-26 DIAGNOSIS — E538 Deficiency of other specified B group vitamins: Secondary | ICD-10-CM

## 2023-03-26 NOTE — Telephone Encounter (Signed)
Patient need lab orders.

## 2023-03-29 ENCOUNTER — Other Ambulatory Visit (INDEPENDENT_AMBULATORY_CARE_PROVIDER_SITE_OTHER): Payer: BC Managed Care – PPO

## 2023-03-29 ENCOUNTER — Telehealth: Payer: Self-pay

## 2023-03-29 DIAGNOSIS — E538 Deficiency of other specified B group vitamins: Secondary | ICD-10-CM | POA: Diagnosis not present

## 2023-03-29 LAB — VITAMIN B12: Vitamin B-12: 1004 pg/mL — ABNORMAL HIGH (ref 211–911)

## 2023-03-29 NOTE — Telephone Encounter (Signed)
Lvm for pt to give office a call back in regards to labs see message below

## 2023-03-29 NOTE — Telephone Encounter (Signed)
-----   Message from [facility] sent at 03/29/2023  1:00 PM EST ----- Her B12 remains elevated. Has she stopped her supplement completely or taking a lower dose?

## 2023-04-07 ENCOUNTER — Other Ambulatory Visit: Payer: Self-pay | Admitting: Nurse Practitioner

## 2023-04-09 MED ORDER — MONTELUKAST SODIUM 10 MG PO TABS: 10.0000 mg | ORAL_TABLET | Freq: Every day | ORAL | 1 refills | Status: DC

## 2023-04-09 NOTE — Telephone Encounter (Signed)
Prescription sent to [facility] as requested.

## 2023-04-11 DIAGNOSIS — Z1231 Encounter for screening mammogram for malignant neoplasm of breast: Secondary | ICD-10-CM

## 2023-06-04 DIAGNOSIS — Z01411 Encounter for gynecological examination (general) (routine) with abnormal findings: Secondary | ICD-10-CM | POA: Diagnosis not present

## 2023-07-02 DIAGNOSIS — Z3202 Encounter for pregnancy test, result negative: Secondary | ICD-10-CM | POA: Diagnosis not present

## 2023-07-02 DIAGNOSIS — Z30433 Encounter for removal and reinsertion of intrauterine contraceptive device: Secondary | ICD-10-CM | POA: Diagnosis not present

## 2023-07-02 DIAGNOSIS — Z113 Encounter for screening for infections with a predominantly sexual mode of transmission: Secondary | ICD-10-CM | POA: Diagnosis not present

## 2023-07-18 ENCOUNTER — Telehealth: Payer: Self-pay

## 2023-07-18 ENCOUNTER — Other Ambulatory Visit (HOSPITAL_COMMUNITY): Payer: Self-pay

## 2023-07-18 NOTE — Telephone Encounter (Signed)
 Pharmacy Patient Advocate Encounter   Received notification from CoverMyMeds that prior authorization for Zepbound 2.5MG /0.5ML pen-injectors is due for renewal.   Insurance verification completed.   The patient is insured through [employer].  Action: PA required and submitted KEY/EOC/Request #: [id number] APPROVED from 06/18/23 to 03/14/24. Unable to obtain price due to refill too soon rejection, last fill date 06/29/23 next available fill date4/25/25

## 2023-07-24 ENCOUNTER — Encounter: Payer: Self-pay | Admitting: Nurse Practitioner

## 2023-08-01 ENCOUNTER — Ambulatory Visit: Admitting: Nurse Practitioner

## 2023-08-06 DIAGNOSIS — L700 Acne vulgaris: Secondary | ICD-10-CM | POA: Diagnosis not present

## 2023-08-07 ENCOUNTER — Encounter: Payer: Self-pay | Admitting: Nurse Practitioner

## 2023-08-07 ENCOUNTER — Ambulatory Visit (INDEPENDENT_AMBULATORY_CARE_PROVIDER_SITE_OTHER): Admitting: Nurse Practitioner

## 2023-08-07 VITALS — BP 110/70 | Temp 98.8°F | Ht 62.0 in | Wt 145.4 lb

## 2023-08-07 DIAGNOSIS — L732 Hidradenitis suppurativa: Secondary | ICD-10-CM | POA: Diagnosis not present

## 2023-08-07 DIAGNOSIS — J4530 Mild persistent asthma, uncomplicated: Secondary | ICD-10-CM

## 2023-08-07 MED ORDER — AIRSUPRA 90-80 MCG/ACT IN AERO: 2.0000 | INHALATION_SPRAY | RESPIRATORY_TRACT | 5 refills | Status: AC | PRN

## 2023-08-07 MED ORDER — BUDESONIDE-FORMOTEROL FUMARATE 160-4.5 MCG/ACT IN AERO
2.0000 | INHALATION_SPRAY | Freq: Two times a day (BID) | RESPIRATORY_TRACT | 5 refills | Status: AC
Start: 2023-08-07 — End: ?

## 2023-08-07 MED ORDER — METRONIDAZOLE 0.75 % EX GEL
1.0000 | Freq: Two times a day (BID) | CUTANEOUS | 2 refills | Status: DC
Start: 2023-08-07 — End: 2024-02-19

## 2023-08-07 NOTE — Assessment & Plan Note (Signed)
 Mild tenderness and several nodules in the right axilla are consistent with HS. Could also be due to a new deodorant. Do not feel that oral antibiotics are warranted. No abscess present. Prescribe Metronidazole gel for the axilla and monitor for infection signs such as increased erythema, size, or pain. Consider reverting to the previous deodorant if issues persist.

## 2023-08-07 NOTE — Progress Notes (Signed)
 [name], NP-C Phone: [phone]  Kara Tate is a 43 y.o. female who presents today for asthma.   Discussed the use of AI scribe software for clinical note transcription with the patient, who gave verbal consent to proceed.  History of Present Illness   Kara Tate is a 43 year old female with asthma who presents with increased inhaler use and chest tightness.  Over the past month, she has experienced increased asthma symptoms, including chest tightness, particularly in the morning upon waking, occasionally throughout the day, and sometimes at night. She has not experienced wheezing or coughing, and her allergies remain stable despite the seasonal change. Her use of the albuterol inhaler has increased from a couple of times a week to possibly a couple of times a day.  There was a recent change in her medication from Advair to [facility], which she believes may have been due to an insurance change. She has been on Advair for approximately a decade, initially at a dose of 250 mcg, which was increased to 500 mcg years ago due to more frequent illnesses. She is concerned about long-term steroid use but recalls that non-steroid treatments like Pulmicort were ineffective in the past.  Additionally, she mentions a new issue with her right armpit, where she noticed pain and small welts. She usually waxes and has not done so recently, and she has switched deodorants from Native to [facility] with an aluminum-free formula. She wonders if the deodorant change could be related to the symptoms. No significant sweating issues beyond normal exercise-related sweating.      Social History   Tobacco Use  Smoking Status Never  Smokeless Tobacco Never    Current Outpatient Medications on File Prior to Visit  Medication Sig Dispense Refill   buPROPion (WELLBUTRIN XL) 300 MG 24 hr tablet Take 300 mg by mouth daily.     Cholecalciferol (VITAMIN D-1000 MAX ST) 25 MCG (1000 UT) tablet Take 1,000 Units by  mouth daily.     clonazePAM (KLONOPIN) 0.25 MG disintegrating tablet Take 0.25 mg by mouth 2 (two) times daily as needed.     dicyclomine (BENTYL) 20 MG tablet Take 1 tablet (20 mg total) by mouth 2 (two) times daily. 20 tablet 0   hydrOXYzine (ATARAX) 25 MG tablet Take 25 mg by mouth every 8 (eight) hours as needed.     hyoscyamine (LEVSIN SL) 0.125 MG SL tablet Take 2 tablets (0.25 mg total) by mouth every 6 (six) hours as needed. 30 tablet 5   meloxicam (MOBIC) 15 MG tablet Take 7.5 mg by mouth as needed for pain.     montelukast (SINGULAIR) 10 MG tablet Take 1 tablet (10 mg total) by mouth at bedtime. 90 tablet 1   ondansetron (ZOFRAN-ODT) 4 MG disintegrating tablet Take 1 tablet (4 mg total) by mouth every 8 (eight) hours as needed for nausea or vomiting. 20 tablet 0   spironolactone (ALDACTONE) 50 MG tablet Take 50 mg by mouth 2 (two) times daily.     tirzepatide (ZEPBOUND) 5 MG/0.5ML Pen Inject 5 mg into the skin once a week. 6 mL 1   No current facility-administered medications on file prior to visit.     ROS see history of present illness  Objective  Physical Exam Vitals:   08/07/23 0836  BP: 110/70  Temp: 98.8 F (37.1 C)    BP Readings from Last 3 Encounters:  08/07/23 110/70  02/18/23 122/80  02/07/23 110/70   Wt Readings from Last 3 Encounters:  08/07/23  145 lb 6.4 oz (66 kg)  02/07/23 168 lb 9.6 oz (76.5 kg)  10/30/22 189 lb 6.4 oz (85.9 kg)    Physical Exam Constitutional:      General: She is not in acute distress.    Appearance: Normal appearance.  HENT:     Head: Normocephalic.  Cardiovascular:     Rate and Rhythm: Normal rate and regular rhythm.     Heart sounds: Normal heart sounds.  Pulmonary:     Effort: Pulmonary effort is normal.     Breath sounds: Normal breath sounds. No wheezing.  Chest:     Comments: Right axilla with 5-6 small inflamed nodules, no redness. No drainage. Mild tenderness. Consistent with HS.  Skin:    General: Skin is  warm and dry.  Neurological:     General: No focal deficit present.     Mental Status: She is alert.  Psychiatric:        Mood and Affect: Mood normal.        Behavior: Behavior normal.     Assessment/Plan: Please see individual problem list.  Mild persistent asthma without complication Assessment & Plan: She reports increased albuterol use and chest tightness, suggesting the recent switch to [facility] may be less effective. There is no wheezing, coughing, or dyspnea, and oral steroids are not needed. To prevent exacerbations, switch to Airsupra inhaler for as-needed use and change the maintenance inhaler from Advair to Symbicort. Continue Singulair, Zyrtec, and Flonase. Return precautions given to patient.   Orders: -     Airsupra; Inhale 2 puffs into the lungs every 4 (four) hours as needed.  Dispense: 10.7 g; Refill: 5 -     Budesonide-Formoterol Fumarate; Inhale 2 puffs into the lungs 2 (two) times daily.  Dispense: 10.2 g; Refill: 5  Hidradenitis suppurativa of right axilla Assessment & Plan: Mild tenderness and several nodules in the right axilla are consistent with HS. Could also be due to a new deodorant. Do not feel that oral antibiotics are warranted. No abscess present. Prescribe Metronidazole gel for the axilla and monitor for infection signs such as increased erythema, size, or pain. Consider reverting to the previous deodorant if issues persist.   Orders: -     metroNIDAZOLE; Apply 1 Application topically 2 (two) times daily.  Dispense: 45 g; Refill: 2    Return if symptoms worsen or fail to improve.   [name], NP-C [facility]

## 2023-08-07 NOTE — Assessment & Plan Note (Signed)
 She reports increased albuterol use and chest tightness, suggesting the recent switch to [facility] may be less effective. There is no wheezing, coughing, or dyspnea, and oral steroids are not needed. To prevent exacerbations, switch to Airsupra inhaler for as-needed use and change the maintenance inhaler from Advair to Symbicort. Continue Singulair, Zyrtec, and Flonase. Return precautions given to patient.

## 2023-08-14 DIAGNOSIS — Z30431 Encounter for routine checking of intrauterine contraceptive device: Secondary | ICD-10-CM | POA: Diagnosis not present

## 2023-08-19 ENCOUNTER — Ambulatory Visit: Admitting: Nurse Practitioner

## 2023-09-03 DIAGNOSIS — F411 Generalized anxiety disorder: Secondary | ICD-10-CM | POA: Diagnosis not present

## 2023-10-02 ENCOUNTER — Other Ambulatory Visit: Payer: Self-pay | Admitting: Nurse Practitioner

## 2023-10-18 ENCOUNTER — Encounter: Payer: Self-pay | Admitting: Nurse Practitioner

## 2023-10-18 DIAGNOSIS — J4530 Mild persistent asthma, uncomplicated: Secondary | ICD-10-CM

## 2023-10-18 MED ORDER — BUDESONIDE-FORMOTEROL FUMARATE 160-4.5 MCG/ACT IN AERO: 2.0000 | INHALATION_SPRAY | Freq: Two times a day (BID) | RESPIRATORY_TRACT | 5 refills | Status: DC

## 2023-10-22 MED ORDER — BUDESONIDE-FORMOTEROL FUMARATE 160-4.5 MCG/ACT IN AERO
2.0000 | INHALATION_SPRAY | Freq: Two times a day (BID) | RESPIRATORY_TRACT | 3 refills | Status: DC
Start: 2023-10-22 — End: 2024-02-19

## 2023-10-31 ENCOUNTER — Ambulatory Visit

## 2023-11-28 DIAGNOSIS — F411 Generalized anxiety disorder: Secondary | ICD-10-CM | POA: Diagnosis not present

## 2023-12-24 ENCOUNTER — Ambulatory Visit: Admission: EM | Admit: 2023-12-24 | Discharge: 2023-12-24 | Disposition: A | Discharge status: Home / Self Care

## 2023-12-24 DIAGNOSIS — N898 Other specified noninflammatory disorders of vagina: Secondary | ICD-10-CM | POA: Diagnosis not present

## 2023-12-24 DIAGNOSIS — R82998 Other abnormal findings in urine: Secondary | ICD-10-CM | POA: Diagnosis not present

## 2023-12-24 DIAGNOSIS — L292 Pruritus vulvae: Secondary | ICD-10-CM | POA: Diagnosis not present

## 2023-12-24 LAB — POCT URINE PREGNANCY: Preg Test, Ur: NEGATIVE

## 2023-12-24 LAB — POCT URINE DIPSTICK
Bilirubin, UA: NEGATIVE
Blood, UA: NEGATIVE
Glucose, UA: NEGATIVE mg/dL
Nitrite, UA: NEGATIVE
Spec Grav, UA: 1.02 (ref 1.010–1.025)
Urobilinogen, UA: 1 U/dL
pH, UA: 8 (ref 5.0–8.0)

## 2023-12-24 MED ORDER — FLUCONAZOLE 150 MG PO TABS
150.0000 mg | ORAL_TABLET | Freq: Every day | ORAL | 0 refills | Status: DC
Start: 2023-12-24 — End: 2024-01-20

## 2023-12-24 NOTE — ED Triage Notes (Addendum)
 Patient to Urgent Care with complaints of vaginal burning and itching/ discomfort. Reports she has been breaking out in full body rashes.   Symptoms x2 days. Attempted to use monistat w/o relief.   Denies any urinary symptoms.

## 2023-12-24 NOTE — ED Provider Notes (Signed)
 [name]    CSN: [id number] Arrival date & time: 12/24/23  9050      History   Chief Complaint Chief Complaint  Patient presents with   Vaginal Itching    HPI Kara Tate is a 43 y.o. female.  Patient presents with vaginal itching and small amount of vaginal discharge x 2 days.  She attempted treatment with OTC Monistat without relief.  She has external labial burning when she urinates; she does not have bladder discomfort.  She denies fever, chills, abdominal pain, hematuria, flank pain, pelvic pain.  The history is provided by the patient and medical records.    Past Medical History:  Diagnosis Date   Allergy    Asthma    Frequent headaches    Hypertension     Patient Active Problem List   Diagnosis Date Noted   Hidradenitis suppurativa of right axilla 08/07/2023   Preventative health care 02/07/2023   B12 deficiency 02/07/2023   Obesity (BMI 30-39.9) 11/06/2022   Irritable bowel syndrome with diarrhea 09/29/2021   Anxiety 03/15/2020   GERD (gastroesophageal reflux disease) 03/15/2020   Mild persistent asthma without complication 03/15/2020   S/P bariatric surgery 03/15/2020    Past Surgical History:  Procedure Laterality Date   CESAREAN SECTION     GASTRECTOMY     GASTRIC BYPASS     LAPAROSCOPIC GASTRIC BANDING      OB History   No obstetric history on file.      Home Medications    Prior to Admission medications   Medication Sig Start Date End Date Taking? Authorizing Provider  citalopram (CELEXA) 10 MG tablet Take 10 mg by mouth daily. 11/28/23  Yes [provider]  fluconazole  (DIFLUCAN ) 150 MG tablet Take 1 tablet (150 mg total) by mouth daily. Take one tablet today.  May repeat in 3 days. 12/24/23  Yes Corlis Burnard DEL, NP  Albuterol -Budesonide  (AIRSUPRA ) 90-80 MCG/ACT AERO Inhale 2 puffs into the lungs every 4 (four) hours as needed. 08/07/23   Gretel App, NP  budesonide -formoterol  (SYMBICORT ) 160-4.5 MCG/ACT inhaler Inhale  2 puffs into the lungs 2 (two) times daily. 10/22/23   Lester, Kacy, NP  buPROPion (WELLBUTRIN XL) 300 MG 24 hr tablet Take 300 mg by mouth daily. Patient not taking: Reported on 12/24/2023 08/28/22   [provider]  Cholecalciferol (VITAMIN D -1000 MAX ST) 25 MCG (1000 UT) tablet Take 1,000 Units by mouth daily.    [provider]  clonazePAM (KLONOPIN) 0.25 MG disintegrating tablet Take 0.25 mg by mouth 2 (two) times daily as needed.    [provider]  dicyclomine  (BENTYL ) 20 MG tablet Take 1 tablet (20 mg total) by mouth 2 (two) times daily. 02/18/23   Raspet, Erin K, PA-C  hydrOXYzine (ATARAX) 25 MG tablet Take 25 mg by mouth every 8 (eight) hours as needed.    [provider]  hyoscyamine  (LEVSIN  SL) 0.125 MG SL tablet Take 2 tablets (0.25 mg total) by mouth every 6 (six) hours as needed. 03/22/23   Gretel App, NP  meloxicam (MOBIC) 15 MG tablet Take 7.5 mg by mouth as needed for pain.    [provider]  metroNIDAZOLE  (METROGEL ) 0.75 % gel Apply 1 Application topically 2 (two) times daily. 08/07/23   Gretel App, NP  montelukast  (SINGULAIR ) 10 MG tablet TAKE 1 TABLET BY MOUTH EVERYDAY AT BEDTIME 10/03/23   Gretel App, NP  ondansetron  (ZOFRAN -ODT) 4 MG disintegrating tablet Take 1 tablet (4 mg total) by mouth every 8 (  eight) hours as needed for nausea or vomiting. 02/18/23   Raspet, Erin K, PA-C  spironolactone (ALDACTONE) 50 MG tablet Take 50 mg by mouth 2 (two) times daily. 02/07/23   [provider]  tirzepatide  (ZEPBOUND ) 5 MG/0.5ML Pen Inject 5 mg into the skin once a week. 03/04/23   Gretel App, NP    Family History Family History  Problem Relation Age of Onset   Hyperlipidemia Mother    Diabetes Mother    Depression Mother    Miscarriages / Stillbirths Maternal Grandmother    Hypertension Maternal Grandmother    Depression Maternal Grandmother    COPD Maternal Grandmother    Asthma Maternal Grandmother    Arthritis Maternal  Grandmother    Hypertension Maternal Grandfather    Hyperlipidemia Maternal Grandfather    Cancer Maternal Grandfather    Depression Maternal Grandfather    Arthritis Maternal Grandfather    Heart attack Maternal Grandfather    Stroke Maternal Grandfather     Social History Social History   Tobacco Use   Smoking status: Never   Smokeless tobacco: Never  Substance Use Topics   Alcohol use: Never   Drug use: Never     Allergies   Hydrocodone, Pneumococcal vaccine, and Pneumococcal vaccines   Review of Systems Review of Systems  Constitutional:  Negative for fever.  Gastrointestinal:  Negative for abdominal pain.  Genitourinary:  Positive for vaginal discharge. Negative for dysuria, flank pain, hematuria and pelvic pain.     Physical Exam Triage Vital Signs ED Triage Vitals  Encounter Vitals Group     BP      Girls Systolic BP Percentile      Girls Diastolic BP Percentile      Boys Systolic BP Percentile      Boys Diastolic BP Percentile      Pulse      Resp      Temp      Temp src      SpO2      Weight      Height      Head Circumference      Peak Flow      Pain Score      Pain Loc      Pain Education      Exclude from Growth Chart    No data found.  Updated Vital Signs BP 106/69   Pulse 67   Temp 98 F (36.7 C)   Resp 18   SpO2 98%   Visual Acuity Right Eye Distance:   Left Eye Distance:   Bilateral Distance:    Right Eye Near:   Left Eye Near:    Bilateral Near:     Physical Exam Constitutional:      General: She is not in acute distress. HENT:     Mouth/Throat:     Mouth: Mucous membranes are moist.  Cardiovascular:     Rate and Rhythm: Normal rate and regular rhythm.  Pulmonary:     Effort: Pulmonary effort is normal. No respiratory distress.  Abdominal:     General: Bowel sounds are normal.     Palpations: Abdomen is soft.     Tenderness: There is no abdominal tenderness. There is no right CVA tenderness, left CVA tenderness,  guarding or rebound.  Genitourinary:    Comments: No GU exam performed today per patient preference. Neurological:     Mental Status: She is alert.      UC Treatments / Results  Labs (all labs  ordered are listed, but only abnormal results are displayed) Labs Reviewed  POCT URINE DIPSTICK - Abnormal; Notable for the following components:      Result Value   Color, UA straw (*)    Ketones, POC UA trace (5) (*)    Protein Ur, POC trace (*)    Leukocytes, UA Large (3+) (*)    All other components within normal limits  POCT URINE PREGNANCY - Normal  URINE CULTURE  CERVICOVAGINAL ANCILLARY ONLY    EKG   Radiology No results found.  Procedures Procedures (including critical care time)  Medications Ordered in UC Medications - No data to display  Initial Impression / Assessment and Plan / UC Course  I have reviewed the triage vital signs and the nursing notes.  Pertinent labs & imaging results that were available during my care of the patient were reviewed by me and considered in my medical decision making (see chart for details).    Vaginal itching, leukocytes in urine.  Urine pregnancy negative.  Urine culture pending.  Discussed with patient that we will call her if the culture shows the need for treatment.  Treating vaginal symptoms today with Diflucan .  Patient obtained vaginal self swab for testing for yeast and BV.  She declines STD testing.  Instructed her to follow-up with her PCP or gynecologist if her symptoms are not improving with treatment.  Discussed that we will call her if the vaginal swab shows the need for additional treatment.  Education provided on vaginal yeast infection.  Patient agrees to plan of care.  Final Clinical Impressions(s) / UC Diagnoses   Final diagnoses:  Vaginal itching  Leukocytes in urine     Discharge Instructions      Take the Diflucan  as directed.  Your vaginal test are pending.  We will call you if additional treatment is  needed.  Follow-up with your primary care provider or gynecologist if your symptoms are not improving.    Because your urine had leukocytes today, a urine culture is pending.  We will call you if treatment is needed.     ED Prescriptions     Medication Sig Dispense Auth. Provider   fluconazole  (DIFLUCAN ) 150 MG tablet Take 1 tablet (150 mg total) by mouth daily. Take one tablet today.  May repeat in 3 days. 2 tablet Corlis Burnard DEL, NP      PDMP not reviewed this encounter.   Corlis Burnard DEL, NP 12/24/23 1023

## 2023-12-24 NOTE — Discharge Instructions (Addendum)
 Take the Diflucan  as directed.  Your vaginal test are pending.  We will call you if additional treatment is needed.  Follow-up with your primary care provider or gynecologist if your symptoms are not improving.    Because your urine had leukocytes today, a urine culture is pending.  We will call you if treatment is needed.

## 2023-12-25 ENCOUNTER — Ambulatory Visit (HOSPITAL_COMMUNITY): Payer: Self-pay

## 2023-12-25 LAB — CERVICOVAGINAL ANCILLARY ONLY
Bacterial Vaginitis (gardnerella): NEGATIVE
Candida Glabrata: NEGATIVE
Candida Vaginitis: POSITIVE — AB
Comment: NEGATIVE
Comment: NEGATIVE
Comment: NEGATIVE

## 2023-12-25 LAB — URINE CULTURE: Culture: NO GROWTH

## 2024-01-01 DIAGNOSIS — F411 Generalized anxiety disorder: Secondary | ICD-10-CM | POA: Diagnosis not present

## 2024-01-08 DIAGNOSIS — F411 Generalized anxiety disorder: Secondary | ICD-10-CM | POA: Diagnosis not present

## 2024-01-13 ENCOUNTER — Other Ambulatory Visit: Payer: Self-pay | Admitting: Nurse Practitioner

## 2024-01-20 ENCOUNTER — Telehealth: Admitting: Family Medicine

## 2024-01-20 DIAGNOSIS — N760 Acute vaginitis: Secondary | ICD-10-CM | POA: Diagnosis not present

## 2024-01-20 MED ORDER — FLUCONAZOLE 150 MG PO TABS
150.0000 mg | ORAL_TABLET | ORAL | 0 refills | Status: DC
Start: 2024-01-20 — End: 2024-02-19

## 2024-01-20 NOTE — Progress Notes (Signed)

## 2024-02-05 DIAGNOSIS — F411 Generalized anxiety disorder: Secondary | ICD-10-CM | POA: Diagnosis not present

## 2024-02-11 DIAGNOSIS — F411 Generalized anxiety disorder: Secondary | ICD-10-CM | POA: Diagnosis not present

## 2024-02-12 ENCOUNTER — Encounter: Payer: BC Managed Care – PPO | Admitting: Nurse Practitioner

## 2024-02-12 ENCOUNTER — Telehealth: Payer: Self-pay

## 2024-02-12 ENCOUNTER — Telehealth: Payer: Self-pay | Admitting: Nurse Practitioner

## 2024-02-12 NOTE — Telephone Encounter (Signed)
 Copied from CRM #[id number]. Topic: Appointments - Scheduling Inquiry for Clinic >> Feb 12, 2024  8:24 AM [name] wrote: Reason for CRM: Patient called in to confirm her Physical Visit time was updated from 11:00 am to 1:40 pm.  After looking at the encounter between PCP & patient. I can not confirm nor reschedule the patient for that time, as it is not available in Epic.   PCP/PCP Team Please advise.  I spoke with patient and rescheduled appointment.

## 2024-02-12 NOTE — Telephone Encounter (Signed)
 She was supposed to see Leron Glance but there are no appointments for that day so they put her on your schedule then later she will transfer to Leron Glance.

## 2024-02-12 NOTE — Telephone Encounter (Signed)
 Copied from CRM #[id number]. 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 She does not want to wait until November (next avail with Kacy).  Can we please get her worked in another time?  Patient callback is [phone]  I left voicemail for patient asking if she would be willing to see Dr. Hans Baptist on 02/19/2024 at 1:40pm for her physical.  I spoke with patient when I called a second time.  Patient states she would be willing to see Dr. Baptist on 02/19/2024 at 1:40pm, as long as she will still be able to keep Leron Glance, FNP-C, as her PCP.  I let patient know that Leron will still be her PCP.  I rescheduled appointment for patient.

## 2024-02-12 NOTE — Telephone Encounter (Signed)
 Copied from CRM #[id number]. Topic: Appointments - Scheduling Inquiry for Clinic >> Feb 12, 2024  8:24 AM Kara Tate wrote: Reason for CRM: Patient called in to confirm her Physical Visit time was updated from 11:00 am to 1:40 pm.  After looking at the encounter between PCP & patient. I can not confirm nor reschedule the patient for that time, as it is not available in Epic.   PCP/PCP Team Please advise.

## 2024-02-12 NOTE — Telephone Encounter (Signed)
 Lm and sent MyChart message that appointment has been reschedule to 02/19/2024.

## 2024-02-14 ENCOUNTER — Telehealth: Payer: Self-pay

## 2024-02-19 ENCOUNTER — Ambulatory Visit (INDEPENDENT_AMBULATORY_CARE_PROVIDER_SITE_OTHER): Admitting: Internal Medicine

## 2024-02-19 ENCOUNTER — Encounter: Admitting: Nurse Practitioner

## 2024-02-19 ENCOUNTER — Encounter: Payer: Self-pay | Admitting: Internal Medicine

## 2024-02-19 ENCOUNTER — Telehealth: Payer: Self-pay

## 2024-02-19 VITALS — BP 120/76 | HR 82 | Temp 98.3°F | Ht 62.0 in | Wt 153.2 lb

## 2024-02-19 DIAGNOSIS — J4530 Mild persistent asthma, uncomplicated: Secondary | ICD-10-CM

## 2024-02-19 DIAGNOSIS — Z0000 Encounter for general adult medical examination without abnormal findings: Secondary | ICD-10-CM

## 2024-02-19 DIAGNOSIS — E538 Deficiency of other specified B group vitamins: Secondary | ICD-10-CM

## 2024-02-19 DIAGNOSIS — E669 Obesity, unspecified: Secondary | ICD-10-CM

## 2024-02-19 DIAGNOSIS — F419 Anxiety disorder, unspecified: Secondary | ICD-10-CM

## 2024-02-19 DIAGNOSIS — Z114 Encounter for screening for human immunodeficiency virus [HIV]: Secondary | ICD-10-CM | POA: Diagnosis not present

## 2024-02-19 DIAGNOSIS — Z1159 Encounter for screening for other viral diseases: Secondary | ICD-10-CM | POA: Diagnosis not present

## 2024-02-19 MED ORDER — MIRTAZAPINE 15 MG PO TBDP
7.5000 mg | ORAL_TABLET | Freq: Every day | ORAL | Status: AC
Start: 2024-02-19 — End: ?

## 2024-02-19 NOTE — Assessment & Plan Note (Signed)
-   Patient's Pap screening is up-to-date.  Will obtain results from [facility] clinic -Patient has not yet had a mammogram and order was put in for this today -She is up-to-date on her tetanus vaccine -Will check HIV and hep C screening today -No further workup at this time

## 2024-02-19 NOTE — Assessment & Plan Note (Signed)
-   This problem is chronic and stable -Patient states that she has maintained her weight around 150 pounds and feels good at this weight -She is currently taking Zepbound  1 time monthly and this helps her with her weight maintenance -Will continue with Zepbound  5 mg once monthly for now -No further workup at this time
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
## 2024-02-19 NOTE — Telephone Encounter (Signed)
 Per Dr. Onesimo sent a request to [facility] for the last Pap record.

## 2024-02-19 NOTE — Assessment & Plan Note (Signed)
-   This problem is chronic and stable -Last vitamin B12 level was elevated -Will recheck vitamin B12 levels today -No further workup at this time
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
## 2024-02-19 NOTE — Assessment & Plan Note (Signed)
-   This problem is chronic and stable -Patient is currently on Airsupra  and takes medication twice a day for the last week (normally takes it only once a day) -She has noticed some increased chest tightness since the weather changed but no wheezing or shortness of breath or cough -On exam, her lungs are clear to auscultation -Will continue with Airsupra  for now -Patient instructed to follow-up with us  if she does have worsening symptoms or if her symptoms do not improve -No further workup at this time

## 2024-02-20 ENCOUNTER — Ambulatory Visit: Payer: Self-pay | Admitting: Internal Medicine

## 2024-02-20 ENCOUNTER — Telehealth: Payer: Self-pay

## 2024-02-20 LAB — CBC WITH DIFFERENTIAL/PLATELET
Basophils Absolute: 0.1 K/uL (ref 0.0–0.1)
Basophils Relative: 1.4 % (ref 0.0–3.0)
Eosinophils Absolute: 0.1 K/uL (ref 0.0–0.7)
Eosinophils Relative: 1.6 % (ref 0.0–5.0)
HCT: 37.8 % (ref 36.0–46.0)
Hemoglobin: 12.5 g/dL (ref 12.0–15.0)
Lymphocytes Relative: 36 % (ref 12.0–46.0)
Lymphs Abs: 1.9 K/uL (ref 0.7–4.0)
MCHC: 33 g/dL (ref 30.0–36.0)
MCV: 90.4 fl (ref 78.0–100.0)
Monocytes Absolute: 0.5 K/uL (ref 0.1–1.0)
Monocytes Relative: 9.1 % (ref 3.0–12.0)
Neutro Abs: 2.8 K/uL (ref 1.4–7.7)
Neutrophils Relative %: 51.9 % (ref 43.0–77.0)
Platelets: 281 K/uL (ref 150.0–400.0)
RBC: 4.18 Mil/uL (ref 3.87–5.11)
RDW: 13.1 % (ref 11.5–15.5)
WBC: 5.3 K/uL (ref 4.0–10.5)

## 2024-02-20 LAB — COMPREHENSIVE METABOLIC PANEL WITH GFR
ALT: 18 U/L (ref 0–35)
AST: 26 U/L (ref 0–37)
Albumin: 4.6 g/dL (ref 3.5–5.2)
Alkaline Phosphatase: 61 U/L (ref 39–117)
BUN: 14 mg/dL (ref 6–23)
CO2: 25 meq/L (ref 19–32)
Calcium: 9.5 mg/dL (ref 8.4–10.5)
Chloride: 103 meq/L (ref 96–112)
Creatinine, Ser: 0.78 mg/dL (ref 0.40–1.20)
GFR: 94.74 mL/min (ref >60.00)
Glucose, Bld: 41 mg/dL — CL (ref 70–99)
Potassium: 4.2 meq/L (ref 3.5–5.1)
Sodium: 137 meq/L (ref 135–145)
Total Bilirubin: 0.6 mg/dL (ref 0.2–1.2)
Total Protein: 7.2 g/dL (ref 6.0–8.3)

## 2024-02-20 LAB — HIV ANTIBODY (ROUTINE TESTING W REFLEX)
HIV 1&2 Ab, 4th Generation: NONREACTIVE
HIV FINAL INTERPRETATION: NEGATIVE

## 2024-02-20 LAB — LIPID PANEL
Cholesterol: 172 mg/dL (ref 0–200)
HDL: 71.8 mg/dL (ref >39.00)
LDL Cholesterol: 90 mg/dL (ref 0–99)
NonHDL: 100.51
Total CHOL/HDL Ratio: 2
Triglycerides: 53 mg/dL (ref 0.0–149.0)
VLDL: 10.6 mg/dL (ref 0.0–40.0)

## 2024-02-20 LAB — VITAMIN B12: Vitamin B-12: 528 pg/mL (ref 211–911)

## 2024-02-20 LAB — HEPATITIS C ANTIBODY: Hepatitis C Ab: NONREACTIVE

## 2024-02-20 NOTE — Telephone Encounter (Signed)
 CRITICAL VALUE STICKER  CRITICAL VALUE: 43  RECEIVER (on-site recipient of call): La-Tavia P  DATE & TIME NOTIFIED: 02-20-24 @ 2:53 pm  MESSENGER (representative from lab): Carmena  MD NOTIFIED: LESTER  TIME OF NOTIFICATION: 2:53  RESPONSE:  TBD

## 2024-03-04 ENCOUNTER — Encounter: Payer: Self-pay | Admitting: Family Medicine

## 2024-03-04 ENCOUNTER — Ambulatory Visit: Payer: Self-pay

## 2024-03-04 ENCOUNTER — Ambulatory Visit: Admitting: Family Medicine

## 2024-03-04 VITALS — BP 107/66 | HR 67 | Temp 98.2°F | Resp 16

## 2024-03-04 DIAGNOSIS — J069 Acute upper respiratory infection, unspecified: Secondary | ICD-10-CM | POA: Diagnosis not present

## 2024-03-04 LAB — POC COVID19 BINAXNOW: SARS Coronavirus 2 Ag: NEGATIVE

## 2024-03-04 MED ORDER — AZITHROMYCIN 250 MG PO TABS: ORAL_TABLET | ORAL | 0 refills | Status: AC

## 2024-03-04 NOTE — Progress Notes (Signed)
 Established patient visit   Patient: Kara Tate   DOB: 07-Aug-1980   43 y.o. Female  MRN: [id number] Visit Date: 03/04/2024  Today's healthcare provider: Nancyann Perry, MD   Chief Complaint  Patient presents with   URI    Symptoms started early last week with a sore throat, worsening symptoms since Saturday, cough, nasal congestion, chest tightness, and fatigue No fever. Treatment inhaler, TheraFlu, Nyquil, Tylenol,Sudafed, Elderberry and tea with honey.   Subjective    Discussed the use of AI scribe software for clinical note transcription with the patient, who gave verbal consent to proceed.  History of Present Illness   Kara Tate is a 43 year old female with asthma who presents with respiratory symptoms and sinus pressure.  She has been feeling unwell since the middle of last week, with symptoms worsening by Saturday. By Sunday, she experienced a full-blown headache, cough, and nasal congestion, which have prevented her from attending work.  She has a history of asthma and uses Airsupra  and albuterol  inhalers, as well as Singulair . She reports increased use of her rescue inhaler, particularly when coughing, and describes a sensation of tightness when breathing in. The inhaler provides temporary relief, but symptoms return after a while.  She experiences significant sinus pressure and pain, particularly upon waking, along with a sore throat and chest cough. The sore throat is described as 'not super raw' but still sore. No ear pain. Nasal discharge is clear when running but green when coughed up.  She has been around sick individuals at her workplace, including a coworker who has been ill for weeks. She practices good hygiene and typically avoids getting sick, but this episode has been more severe than usual.  She last used her inhaler around lunchtime, approximately three to four hours before the visit. She notes that Monday was the worst day, with today feeling  similar but manageable, though she still experiences chest discomfort.       Medications: Outpatient Medications Prior to Visit  Medication Sig   albuterol  (VENTOLIN  HFA) 108 (90 Base) MCG/ACT inhaler INHALE 2 PUFFS INTO THE LUNGS EVERY 4 (FOUR) HOURS AS NEEDED FOR SHORTNESS OF BREATH OR WHEEZING   Albuterol -Budesonide  (AIRSUPRA ) 90-80 MCG/ACT AERO Inhale 2 puffs into the lungs every 4 (four) hours as needed.   Cholecalciferol (VITAMIN D -1000 MAX ST) 25 MCG (1000 UT) tablet Take 1,000 Units by mouth daily.   clonazePAM (KLONOPIN) 0.25 MG disintegrating tablet Take 0.25 mg by mouth 2 (two) times daily as needed.   hydrOXYzine (ATARAX) 25 MG tablet Take 25 mg by mouth every 8 (eight) hours as needed.   meloxicam (MOBIC) 15 MG tablet Take 7.5 mg by mouth as needed for pain.   mirtazapine (REMERON SOL-TAB) 15 MG disintegrating tablet Take 0.5 tablets (7.5 mg total) by mouth at bedtime.   montelukast  (SINGULAIR ) 10 MG tablet TAKE 1 TABLET BY MOUTH EVERYDAY AT BEDTIME   ondansetron  (ZOFRAN -ODT) 4 MG disintegrating tablet Take 1 tablet (4 mg total) by mouth every 8 (eight) hours as needed for nausea or vomiting.   spironolactone (ALDACTONE) 50 MG tablet Take 50 mg by mouth 2 (two) times daily.   tirzepatide  (ZEPBOUND ) 5 MG/0.5ML Pen Inject 5 mg into the skin once a week.   No facility-administered medications prior to visit.   Review of Systems     Objective    BP 107/66 (BP Location: Left Arm, Patient Position: Sitting, Cuff Size: Normal)   Pulse 67   Temp 98.2 F (  36.8 C) (Oral)   Resp 16   LMP 01/08/2024 (Approximate)   Physical Exam   General Appearance:    Well developed, well nourished female, alert, cooperative, in no acute distress  HENT:   ENT exam normal, no neck nodes or sinus tenderness, neck without nodes, throat normal without erythema or exudate, sinuses nontender, post nasal drip noted, and nasal mucosa congested  Eyes:    PERRL, conjunctiva/corneas clear, EOM's intact        Lungs:     Clear to auscultation bilaterally, respirations unlabored  Heart:    Normal heart rate. Normal rhythm. No murmurs, rubs, or gallops.    Neurologic:   Awake, alert, oriented x 3. No apparent focal neurological           defect.        Results for orders placed or performed in visit on 03/04/24  POC COVID-19 BinaxNow  Result Value Ref Range   SARS Coronavirus 2 Ag Negative Negative     Assessment & Plan        Acute upper respiratory infection Acute viral upper respiratory infection with prolonged symptoms, negative COVID test, considering bacterial infection risk.  Discussed continued symptomatic treatment for what started as viral URI. Considering duration of illness, underlying asthma and lack if improvement she prefers to start antibiotic.  - Prescribed azithromycin (Z-Pak) sent to [facility] in [place]. - Advised symptoms should improve by the end of the weekend.  Asthma Asthma exacerbation due to upper respiratory infection, increased inhaler use, manageable symptoms. - Ensure adequate supply of inhalers.         Nancyann Perry, MD  [facility] [phone] (phone) [phone] (fax)  [facility]

## 2024-03-04 NOTE — Telephone Encounter (Signed)
 Copied from CRM [id number]. Topic: Clinical - Red Word Triage FYI Only or Action Required?: FYI only for provider.  Patient was last seen in primary care on 02/19/2024 by Onesimo Claude, MD.  Called Nurse Triage reporting Cough.  Symptoms began several days ago.  Interventions attempted: OTC medications: Theraflu and Other: Elderberry.  Symptoms are: gradually worsening.  Triage Disposition: See Physician Within 24 Hours  Patient/caregiver understands and will follow disposition?: Yes     >> Mar 04, 2024  8:34 AM Frederich PARAS wrote: [facility] that prompted transfer to Nurse Triage: pain in chest,   cold since weekend, impact astma, pain in chest, headached, sore throat stuffy nose em fatige , took Theraflu, asthma meds and elderberry and nothings working. Deep cough. Reason for Disposition  [1] Continuous (nonstop) coughing interferes with work or school AND [2] no improvement using cough treatment per Care Advice  Answer Assessment - Initial Assessment Questions 1. ONSET: When did the cough begin?      Over the weekend  2. SEVERITY: How bad is the cough today?      Moderate  3. SPUTUM: Describe the color of your sputum (e.g., none, dry cough; clear, white, yellow, green)     Green in color  4. HEMOPTYSIS: Are you coughing up any blood? If Yes, ask: How much? (e.g., flecks, streaks, tablespoons, etc.)     Denies  5. DIFFICULTY BREATHING: Are you having difficulty breathing? If Yes, ask: How bad is it? (e.g., mild, moderate, severe)      Denies  6. FEVER: Do you have a fever? If Yes, ask: What is your temperature, how was it measured, and when did it start?     Denies  7. CARDIAC HISTORY: Do you have any history of heart disease? (e.g., heart attack, congestive heart failure)      Denies  8. LUNG HISTORY: Do you have any history of lung disease?  (e.g., pulmonary embolus, asthma, emphysema)     Asthma  9. OTHER SYMPTOMS: Do you have any other symptoms?  (e.g., runny nose, wheezing, chest pain)       Sore throat, headache, stuffy nose, tightness in chest   Taking theraflu and elderberry for symptoms.  Protocols used: Cough - Acute Productive-A-AH

## 2024-03-04 NOTE — Patient Instructions (Signed)
 SABRA  Please review the attached list of medications and notify my office if there are any errors.   . Please bring all of your medications to every appointment so we can make sure that our medication list is the same as yours.

## 2024-03-09 DIAGNOSIS — F411 Generalized anxiety disorder: Secondary | ICD-10-CM | POA: Diagnosis not present

## 2024-03-17 ENCOUNTER — Other Ambulatory Visit: Payer: Self-pay | Admitting: Nurse Practitioner

## 2024-03-17 ENCOUNTER — Telehealth: Payer: Self-pay

## 2024-03-17 DIAGNOSIS — E669 Obesity, unspecified: Secondary | ICD-10-CM

## 2024-03-17 NOTE — Telephone Encounter (Signed)
 Zepbound  form placed in provider to be signed folder

## 2024-03-24 NOTE — Telephone Encounter (Signed)
 Form faxed to [phone]

## 2024-03-25 ENCOUNTER — Telehealth: Payer: Self-pay

## 2024-03-25 ENCOUNTER — Other Ambulatory Visit (HOSPITAL_COMMUNITY): Payer: Self-pay

## 2024-03-25 NOTE — Telephone Encounter (Signed)
 Pharmacy Patient Advocate Encounter   Received notification from Physician's Office that prior authorization for Zepbound  5MG /0.5ML pen-injectors  is required/requested.   Insurance verification completed.   The patient is insured through [employer].   Per test claim: PA required; PA submitted to above mentioned insurance via Latent Key/confirmation #/[id number] Status is pending

## 2024-03-25 NOTE — Telephone Encounter (Signed)
 Copied from CRM [id number]. Topic: Clinical - Medication Prior Auth >> Mar 25, 2024 10:17 AM Kara Tate wrote: Reason for CRM: Patient has been advised by pharmacy that her Zepbound  needs a prior authorization.

## 2024-03-25 NOTE — Telephone Encounter (Signed)
 Pharmacy Patient Advocate Encounter   Received notification from Physician's Office that prior authorization for Zepbound  5MG /0.5ML pen-injectors is required/requested.   Insurance verification completed.   The patient is insured through [employer].   Per test claim: Refill too soon. PA is not needed at this time. Medication was filled 03/24/2024. Next eligible fill date is 05/30/2024.

## 2024-03-26 ENCOUNTER — Other Ambulatory Visit (HOSPITAL_COMMUNITY): Payer: Self-pay

## 2024-03-26 NOTE — Telephone Encounter (Signed)
 Please see encounter from 03/25/2024

## 2024-04-13 ENCOUNTER — Other Ambulatory Visit: Payer: Self-pay | Admitting: Nurse Practitioner

## 2024-04-20 DIAGNOSIS — F411 Generalized anxiety disorder: Secondary | ICD-10-CM | POA: Diagnosis not present

## 2024-05-06 DIAGNOSIS — F411 Generalized anxiety disorder: Secondary | ICD-10-CM | POA: Diagnosis not present

## 2024-05-12 ENCOUNTER — Other Ambulatory Visit: Payer: Self-pay

## 2024-05-13 MED ORDER — ALBUTEROL SULFATE HFA 108 (90 BASE) MCG/ACT IN AERS: 1.0000 | INHALATION_SPRAY | Freq: Four times a day (QID) | RESPIRATORY_TRACT | 5 refills | Status: AC | PRN

## 2024-05-19 ENCOUNTER — Ambulatory Visit
Admission: EM | Admit: 2024-05-19 | Discharge: 2024-05-19 | Disposition: A | Discharge status: Home / Self Care | Attending: Family Medicine | Admitting: Family Medicine

## 2024-05-19 DIAGNOSIS — N760 Acute vaginitis: Secondary | ICD-10-CM | POA: Diagnosis not present

## 2024-05-19 MED ORDER — FLUCONAZOLE 150 MG PO TABS: 150.0000 mg | ORAL_TABLET | ORAL | 0 refills | Status: AC

## 2024-05-19 NOTE — ED Triage Notes (Signed)
 Pt states that she has some vaginal itching. X2 days  Pt states that she was using Monistat otc.

## 2024-05-19 NOTE — ED Provider Notes (Signed)
 " [facility]  Note:  This document was prepared using Conservation officer, historic buildings and may include unintentional dictation errors.  MRN: [id number] DOB: 07-11-1980  Subjective:   Kara Tate is a 44 y.o. female presenting for 2-day history of recurrent vaginal itching and irritation.  Would like empiric treatment for yeast infection.  Denies fever, n/v, abdominal pain, pelvic pain, rashes, dysuria, urinary frequency, hematuria, vaginal discharge.    Current Outpatient Medications  Medication Instructions   albuterol  (VENTOLIN  HFA) 108 (90 Base) MCG/ACT inhaler 1-2 puffs, Inhalation, Every 6 hours PRN   Albuterol -Budesonide  (AIRSUPRA ) 90-80 MCG/ACT AERO 2 puffs, Inhalation, Every 4 hours PRN   clonazePAM (KLONOPIN) 0.25 mg, 2 times daily PRN   hydrOXYzine (ATARAX) 25 mg, Every 8 hours PRN   meloxicam (MOBIC) 7.5 mg, As needed   mirtazapine  (REMERON  SOL-TAB) 7.5 mg, Oral, Daily at bedtime   montelukast  (SINGULAIR ) 10 MG tablet TAKE 1 TABLET BY MOUTH EVERYDAY AT BEDTIME   ondansetron  (ZOFRAN -ODT) 4 mg, Oral, Every 8 hours PRN   spironolactone (ALDACTONE) 50 mg, 2 times daily   Vitamin D -1000 Max St 1,000 Units, Daily   ZEPBOUND  5 MG/0.5ML Pen INJECT 5 MG UNDER THE SKIN WEEKLY    Allergies[1]  Past Medical History:  Diagnosis Date   Allergy    Asthma    Frequent headaches    Hypertension      Past Surgical History:  Procedure Laterality Date   CESAREAN SECTION     GASTRECTOMY     GASTRIC BYPASS     LAPAROSCOPIC GASTRIC BANDING      Family History  Problem Relation Age of Onset   Hyperlipidemia Mother    Diabetes Mother    Depression Mother    Miscarriages / Stillbirths Maternal Grandmother    Hypertension Maternal Grandmother    Depression Maternal Grandmother    COPD Maternal Grandmother    Asthma Maternal Grandmother    Arthritis Maternal Grandmother    Hypertension Maternal Grandfather    Hyperlipidemia Maternal Grandfather     Cancer Maternal Grandfather    Depression Maternal Grandfather    Arthritis Maternal Grandfather    Heart attack Maternal Grandfather    Stroke Maternal Grandfather     Social History   Occupational History   Not on file  Tobacco Use   Smoking status: Never   Smokeless tobacco: Never  Vaping Use   Vaping status: Never Used  Substance and Sexual Activity   Alcohol use: Never   Drug use: Never   Sexual activity: Not on file     ROS   Objective:   Vitals: BP 114/72 (BP Location: Right Arm)   Pulse 91   Temp 98.7 F (37.1 C) (Oral)   Resp 19   Ht 5' 2.75 (1.594 m)   Wt 155 lb (70.3 kg)   LMP 05/12/2024   SpO2 98%   BMI 27.68 kg/m   Physical Exam Constitutional:      General: She is not in acute distress.    Appearance: Normal appearance. She is well-developed. She is not ill-appearing, toxic-appearing or diaphoretic.  HENT:     Head: Normocephalic and atraumatic.     Nose: Nose normal.     Mouth/Throat:     Mouth: Mucous membranes are moist.     Pharynx: Oropharynx is clear.  Eyes:     General: No scleral icterus.       Right eye: No discharge.        Left eye:  No discharge.     Extraocular Movements: Extraocular movements intact.     Conjunctiva/sclera: Conjunctivae normal.  Cardiovascular:     Rate and Rhythm: Normal rate.  Pulmonary:     Effort: Pulmonary effort is normal.  Abdominal:     General: Bowel sounds are normal. There is no distension.     Palpations: Abdomen is soft. There is no mass.     Tenderness: There is no abdominal tenderness. There is no right CVA tenderness, left CVA tenderness, guarding or rebound.  Skin:    General: Skin is warm and dry.  Neurological:     General: No focal deficit present.     Mental Status: She is alert and oriented to person, place, and time.  Psychiatric:        Mood and Affect: Mood normal.        Behavior: Behavior normal.        Thought Content: Thought content normal.        Judgment: Judgment  normal.     Assessment and Plan :   PDMP not reviewed this encounter.  1. Acute vaginitis      We will treat patient empirically for yeast vaginitis with fluconazole .  Labs pending.  Declined STI testing.  Counseled patient on potential for adverse effects with medications prescribed/recommended today, ER and return-to-clinic precautions discussed, patient verbalized understanding.     [1]  Allergies Allergen Reactions   Hydrocodone Nausea Only   Pneumococcal Vaccine Swelling    Fever   Fever   Pneumococcal Vaccines Swelling    Fever       Christopher Savannah, PA-C 05/19/24 1600  "

## 2024-05-20 ENCOUNTER — Ambulatory Visit (HOSPITAL_COMMUNITY): Payer: Self-pay

## 2024-05-20 LAB — CERVICOVAGINAL ANCILLARY ONLY
Bacterial Vaginitis (gardnerella): NEGATIVE
Candida Glabrata: NEGATIVE
Candida Vaginitis: POSITIVE — AB
Comment: NEGATIVE
Comment: NEGATIVE
Comment: NEGATIVE

## 2024-05-22 ENCOUNTER — Ambulatory Visit: Admitting: Nurse Practitioner

## 2025-02-24 ENCOUNTER — Encounter: Admitting: Nurse Practitioner
